# Patient Record
Sex: Female | Born: 1995
Health system: Southern US, Community
[De-identification: ages and names within clinical notes are randomized; demographics above are authoritative.]

## PROBLEM LIST (undated history)

## (undated) DIAGNOSIS — J189 Pneumonia, unspecified organism: Secondary | ICD-10-CM

## (undated) DIAGNOSIS — Z789 Other specified health status: Secondary | ICD-10-CM

## (undated) HISTORY — PX: WISDOM TOOTH EXTRACTION: SHX21

## (undated) HISTORY — PX: NO PAST SURGERIES: SHX2092

---
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
## 2016-04-23 ENCOUNTER — Other Ambulatory Visit: Payer: Self-pay | Admitting: Physician Assistant

## 2016-04-23 NOTE — Telephone Encounter (Signed)
Medication refilled per protocol. 

## 2016-10-08 ENCOUNTER — Other Ambulatory Visit: Payer: Self-pay | Admitting: Physician Assistant

## 2016-10-09 NOTE — Telephone Encounter (Signed)
BCP denied.  Pt has not been seen in well over 1 year.  Letter to pt to make appt.

## 2016-10-10 ENCOUNTER — Ambulatory Visit (INDEPENDENT_AMBULATORY_CARE_PROVIDER_SITE_OTHER): Payer: Commercial Managed Care - HMO | Admitting: Physician Assistant

## 2016-10-10 ENCOUNTER — Encounter: Payer: Self-pay | Admitting: Physician Assistant

## 2016-10-10 DIAGNOSIS — Z309 Encounter for contraceptive management, unspecified: Secondary | ICD-10-CM | POA: Insufficient documentation

## 2016-10-10 DIAGNOSIS — Z3041 Encounter for surveillance of contraceptive pills: Secondary | ICD-10-CM

## 2016-10-10 MED ORDER — DESOGESTREL-ETHINYL ESTRADIOL 0.15-0.02/0.01 MG (21/5) PO TABS: 1.0000 | ORAL_TABLET | Freq: Every day | ORAL | 2 refills | Status: DC

## 2016-10-10 NOTE — Progress Notes (Signed)
Patient ID: Malachi Bonds P. Raphael MRN: [id number], DOB: Nov 15, 1996, 20 y.o. Date of Encounter: @DATE @  Chief Complaint:  Chief Complaint  Patient presents with  . Follow-up    refill on birth control    HPI: 20 y.o. year old female     05/11/2015: presents with her mom for office visit today.  She is actually being seen as a new patient to our office. Also she has form that needs to be completed in preparation for her going to college.  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 At this time she is planning to major in chemistry.  She has 2 sisters and one brother. She is the youngest child. One of the older siblings went to [employer] and 1 went to [employer]. However mom says that these kids are actually back at home right now!! Kacia says that she did play soccer but currently is not playing any sports right now.  Mom states that Christiane had been going to [facility] [employer] [facility] pediatrics. Because she is now 20 years old they will no longer see her there.  Mom reports that Aminat has no past medical history and no history of any chronic medical problems. She has never had any hospitalizations. Has had no surgeries. Takes no medications at all.  She is interested in starting on birth control pills. Just wants to go ahead and start these to be on the safe side since she is leaving and going away to college. Currently is not sexually active. She and her mom have discussed this and both of them have already decided that this is the best course of action. Her menses have been regular and she has very minimal cramping with them.    AT THAT OV---Rxed OCT  10/10/2016: She presents for follow-up visit today to get refill on her birth control pills. She states that since last visit she has been taking the birth control pills routinely. States that this is causing no adverse effects. Her menstrual bleeding is very regular and she has had no irregular bleeding.  She has had no significant cramping. She does not smoke.  She states that she did go to [employer] last year but did not return for this school year. Currently is at [employer] Community [employer]-----plans to start [employer] next semester.   No past medical history on file.   Home Meds: Outpatient Medications Prior to Visit  Medication Sig Dispense Refill  . nitrofurantoin, macrocrystal-monohydrate, (MACROBID) 100 MG capsule Take 1 capsule (100 mg total) by mouth 2 (two) times daily. 6 capsule 0  . VIORELE 0.15-0.02/0.01 MG (21/5) tablet TAKE 1 TABLET BY MOUTH DAILY. 84 tablet 1   No facility-administered medications prior to visit.     Allergies: No Known Allergies    No family history on file.   Review of Systems:  See HPI for pertinent ROS. All other ROS negative.    Physical Exam: Blood pressure 100/62, pulse 73, temperature 98.4 F (36.9 C), resp. rate 16, weight 107 lb (48.5 kg), last menstrual period 10/09/2016, SpO2 98 %., Body mass index is 20.22 kg/m. General: Petite. WNWD Female. Appears in no acute distress. Neck: Supple. No thyromegaly. No lymphadenopathy. Lungs: Clear bilaterally to auscultation without wheezes, rales, or rhonchi. Breathing is unlabored. Heart: RRR with S1 S2. No murmurs, rubs, or gallops. Abdomen: Soft, non-tender, non-distended with normoactive bowel sounds. No hepatomegaly. No rebound/guarding. No obvious abdominal masses. Musculoskeletal:  Strength and tone  normal for age. Extremities/Skin: Warm and dry.  No rashes or suspicious lesions. Neuro: Alert and oriented X 3. Moves all extremities spontaneously. Gait is normal. CNII-XII grossly in tact. Psych:  Responds to questions appropriately with a normal affect.     ASSESSMENT AND PLAN:  20 y.o. year old female with   1. Encounter for surveillance of contraceptive pills At this time I have sent in a year worth of refills on her birth control pills. She will continue to take these daily at the  same time of day. Follow-up one year or sooner if needed.    [address]igned, Yordi Krager Beth NewcastleDixon, [place], [facility] 10/10/2016 3:45 PM

## 2017-01-03 ENCOUNTER — Encounter: Payer: Self-pay | Admitting: Physician Assistant

## 2017-01-03 ENCOUNTER — Ambulatory Visit (INDEPENDENT_AMBULATORY_CARE_PROVIDER_SITE_OTHER): Payer: Commercial Managed Care - HMO | Admitting: Physician Assistant

## 2017-01-03 VITALS — BP 100/70 | HR 73 | Temp 98.3°F | Resp 16 | Wt 111.0 lb

## 2017-01-03 DIAGNOSIS — Z3041 Encounter for surveillance of contraceptive pills: Secondary | ICD-10-CM | POA: Diagnosis not present

## 2017-01-03 MED ORDER — DESOGESTREL-ETHINYL ESTRADIOL 0.15-30 MG-MCG PO TABS: 1.0000 | ORAL_TABLET | Freq: Every day | ORAL | 11 refills | Status: DC

## 2017-01-03 NOTE — Progress Notes (Signed)
Patient ID: Malachi Bonds P. Priore MRN: [id number], DOB: Dec 29, 1996, 21 y.o. Date of Encounter: @DATE @  Chief Complaint:  Chief Complaint  Patient presents with  . discuss new birth control    HPI: 21 y.o. year old female     05/11/2015: presents with her mom for office visit today.  She is actually being seen as a new patient to our office. Also she has form that needs to be completed in preparation for her going to college.  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  She has 2 sisters and one brother. She is the youngest child. One of the older siblings went to [employer] and 1 went to [employer]. However mom says that these kids are actually back at home right now!! Tatiana says that she did play soccer but currently is not playing any sports right now.  Mom states that Hisayo had been going to [employer] pediatrics. Because she is now 21 years old they will no longer see her there.  Mom reports that Leith has no past medical history and no history of any chronic medical problems. She has never had any hospitalizations. Has had no surgeries. Takes no medications at all.  She is interested in starting on birth control pills. Just wants to go ahead and start these to be on the safe side since she is leaving and going away to college. Currently is not sexually active. She and her mom have discussed this and both of them have already decided that this is the best course of action. Her menses have been regular and she has very minimal cramping with them.    AT THAT OV---Rxed OCT  10/10/2016: She presents for follow-up visit today to get refill on her birth control pills. She states that since last visit she has been taking the birth control pills routinely. States that this is causing no adverse effects. Her menstrual bleeding is very regular and she has had no irregular bleeding. She has had  no significant cramping. She does not smoke.  She states that she did go to [employer] last year but did not return for this school year. Currently is at [employer] Community [employer]-----plans to start [employer] next semester.  01/03/2017: She reports that the cost on her birth control pills keeps going up. Cost has gone up to more than $100 and she cannot afford that. Her sister has helped her research and she knows which pill would be inexpensive but similar to her current pill.----Apri----needs to change to Apri.. No other concerns to address today.   No past medical history on file.   Home Meds: Outpatient Medications Prior to Visit  Medication Sig Dispense Refill  . desogestrel-ethinyl estradiol (VIORELE) 0.15-0.02/0.01 MG (21/5) tablet Take 1 tablet by mouth daily. 84 tablet 2   No facility-administered medications prior to visit.     Allergies: No Known Allergies    No family history on file.   Review of Systems:  See HPI for pertinent ROS. All other ROS negative.    Physical Exam: Blood pressure 100/70, pulse 73, temperature 98.3 F (36.8 C), temperature source Oral, resp. rate 16, weight 111 lb (50.3 kg), last menstrual period 12/31/2016, SpO2 98 %., Body mass index is 20.97 kg/m. General: Petite. WNWD Female. Appears in no acute distress. Neck: Supple. No thyromegaly. No lymphadenopathy. Lungs: Clear bilaterally to auscultation without wheezes, rales,  or rhonchi. Breathing is unlabored. Heart: RRR with S1 S2. No murmurs, rubs, or gallops. Abdomen: Soft, non-tender, non-distended with normoactive bowel sounds. No hepatomegaly. No rebound/guarding. No obvious abdominal masses. Musculoskeletal:  Strength and tone normal for age. Extremities/Skin: Warm and dry.  No rashes or suspicious lesions. Neuro: Alert and oriented X 3. Moves all extremities spontaneously. Gait is normal. CNII-XII grossly in tact. Psych:  Responds to questions appropriately with a normal affect.      ASSESSMENT AND PLAN:  21 y.o. year old female with   1. Encounter for surveillance of contraceptive pills At this time I have Ch[facility]. F/U office visit one year or sooner if needed.  Signed, [address]Mary Beth AndersonDixon, [place], [facility] 01/03/2017 8:40 AM

## 2017-12-03 ENCOUNTER — Other Ambulatory Visit: Payer: Self-pay | Admitting: Physician Assistant

## 2017-12-03 DIAGNOSIS — Z3041 Encounter for surveillance of contraceptive pills: Secondary | ICD-10-CM

## 2017-12-03 NOTE — Telephone Encounter (Signed)
Refill appropriate 

## 2017-12-06 ENCOUNTER — Other Ambulatory Visit: Payer: Self-pay

## 2017-12-06 DIAGNOSIS — Z3041 Encounter for surveillance of contraceptive pills: Secondary | ICD-10-CM

## 2017-12-26 ENCOUNTER — Other Ambulatory Visit: Payer: Self-pay

## 2018-01-26 ENCOUNTER — Other Ambulatory Visit: Payer: Self-pay | Admitting: Physician Assistant

## 2018-01-26 DIAGNOSIS — Z3041 Encounter for surveillance of contraceptive pills: Secondary | ICD-10-CM

## 2018-02-24 ENCOUNTER — Other Ambulatory Visit: Payer: Self-pay | Admitting: Physician Assistant

## 2018-02-24 DIAGNOSIS — Z3041 Encounter for surveillance of contraceptive pills: Secondary | ICD-10-CM

## 2018-03-26 ENCOUNTER — Other Ambulatory Visit: Payer: Self-pay | Admitting: Physician Assistant

## 2018-03-26 DIAGNOSIS — Z3041 Encounter for surveillance of contraceptive pills: Secondary | ICD-10-CM

## 2018-03-26 NOTE — Telephone Encounter (Signed)
Rx denied patient is due for an office visit letter mailed for patient to call and schedule an appointment

## 2018-03-27 ENCOUNTER — Encounter: Payer: Self-pay | Admitting: Physician Assistant

## 2018-03-27 ENCOUNTER — Ambulatory Visit: Payer: Commercial Managed Care - HMO | Admitting: Physician Assistant

## 2018-03-27 ENCOUNTER — Other Ambulatory Visit: Payer: Self-pay

## 2018-03-27 VITALS — BP 110/72 | HR 61 | Temp 98.2°F | Resp 14 | Ht 61.0 in | Wt 110.4 lb

## 2018-03-27 DIAGNOSIS — Z3041 Encounter for surveillance of contraceptive pills: Secondary | ICD-10-CM | POA: Diagnosis not present

## 2018-03-27 MED ORDER — DESOGESTREL-ETHINYL ESTRADIOL 0.15-30 MG-MCG PO TABS: 1.0000 | ORAL_TABLET | Freq: Every day | ORAL | 3 refills | Status: DC

## 2018-03-27 NOTE — Progress Notes (Signed)
Patient ID: Michele Poole MRN: [id number], DOB: 1996/11/08, 22 y.o. Date of Encounter: @DATE @  Chief Complaint:  Chief Complaint  Patient presents with  . Medication Refill    HPI: 22 y.o. year old female     05/11/2015: presents with her mom for office visit today.  She is actually being seen as a new patient to our office. Also she has form that needs to be completed in preparation for her going to college.  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 At this time she is planning to major in chemistry.  She has 2 sisters and one brother. She is the youngest child. One of the older siblings went to [employer] and 1 went to [employer]. However mom says that these kids are actually back at home right now!! Michele BondsGloria says that she did play soccer but currently is not playing any sports right now.  Mom states that Michele BondsGloria had been going to [facility] [employer]. Because she is now 22 years old they will no longer see her there.  Mom reports that Michele BondsGloria has no past medical history and no history of any chronic medical problems. She has never had any hospitalizations. Has had no surgeries. Takes no medications at all.  She is interested in starting on birth control pills. Just wants to go ahead and start these to be on the safe side since she is leaving and going away to college. Currently is not sexually active. She and her mom have discussed this and both of them have already decided that this is the best course of action. Her menses have been regular and she has very minimal cramping with them.    AT THAT OV---Rxed OCT  10/10/2016: She presents for follow-up visit today to get refill on her birth control pills. She states that since last visit she has been taking the birth control pills routinely. States that this is causing no adverse effects. Her menstrual bleeding is very regular and she has had no irregular bleeding. She has had no  significant cramping. She does not smoke.  She states that she did go [employer] last year but did not return for this school year. Currently is at [employer]-----plans to start [employer] next semester.  01/03/2017: She reports that the cost on her birth control pills keeps going up. Cost has gone up to more than $100 and she cannot afford that. Her sister has helped her research and she knows which pill would be inexpensive but similar to her current pill.----Apri----needs to change to Apri.. No other concerns to address today.   03/27/2018: Reviewed that at last visit we had changed to the Apri birth control pill.  States that this has worked well for her and has caused no side effects.  Says that she feels the same as she did with prior pill.  Having regular menses every 28 days.  No other concerns to address today.  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     History reviewed. No pertinent past medical history.   Home Meds: Outpatient Medications Prior to Visit  Medication Sig Dispense Refill  . APRI 0.15-30 MG-MCG tablet TAKE 1 TABLET BY MOUTH EVERY DAY 28 tablet 0   No facility-administered medications prior to visit.     Allergies: No Known Allergies  History reviewed. No pertinent family history.   Review of Systems:  See HPI for pertinent ROS. All other ROS negative.    Physical Exam: Blood pressure 110/72, pulse 61, temperature 98.2 F (36.8 C), temperature source Oral, resp. rate 14, height 5\' 1"  (1.549 m), weight 50.1 kg (110 lb 6.4 oz), last menstrual period 03/25/2018, SpO2 98 %., Body mass index is 20.86 kg/m. General: Petite. WNWD Female. Appears in no acute distress. Neck: Supple. No thyromegaly. No lymphadenopathy. Lungs: Clear bilaterally to auscultation without wheezes, rales, or rhonchi. Breathing is unlabored. Heart: RRR with S1 S2. No murmurs, rubs, or  gallops. Abdomen: Soft, non-tender, non-distended with normoactive bowel sounds. No hepatomegaly. No rebound/guarding. No obvious abdominal masses. Musculoskeletal:  Strength and tone normal for age. Extremities/Skin: Warm and dry.  No rashes or suspicious lesions. Neuro: Alert and oriented X 3. Moves all extremities spontaneously. Gait is normal. CNII-XII grossly in tact. Psych:  Responds to questions appropriately with a normal affect.     ASSESSMENT AND PLAN:  22 y.o. year old female with   1. Encounter for surveillance of contraceptive pills  03/27/2018: I discussed that since she is now 22 years old we need to do a Pap smear so we need to do breast exam and pelvic exam.  Was getting the things out to prepare for this and she then says that she is on her menses and has an tampon currently.  Told her that we cannot do that exam today.  Discussed to make sure that for her next visit she schedules this when she would not be on her menses as we will need to do breast exam pelvic exam and Pap smear at that time.  She voices understanding and agrees.  - desogestrel-ethinyl estradiol (APRI) 0.15-30 MG-MCG tablet; Take 1 tablet by mouth daily.  Dispense: 3 Package; Refill: 3   Signed, [address] [place], [place], [facility] 03/27/2018 2:22 PM

## 2018-04-13 DIAGNOSIS — J029 Acute pharyngitis, unspecified: Secondary | ICD-10-CM | POA: Diagnosis not present

## 2018-12-03 ENCOUNTER — Telehealth: Payer: Self-pay | Admitting: Family Medicine

## 2018-12-03 DIAGNOSIS — Z3041 Encounter for surveillance of contraceptive pills: Secondary | ICD-10-CM

## 2018-12-03 MED ORDER — DESOGESTREL-ETHINYL ESTRADIOL 0.15-30 MG-MCG PO TABS: 1.0000 | ORAL_TABLET | Freq: Every day | ORAL | 3 refills | Status: DC

## 2018-12-03 NOTE — Telephone Encounter (Signed)
[facility] [address] She has scheduled an app with Diablo to change from mbd to her. Her last pill is on Friday and she has no refills as of right now.

## 2018-12-03 NOTE — Telephone Encounter (Signed)
Prescription sent to pharmacy.

## 2018-12-12 ENCOUNTER — Ambulatory Visit: Payer: 59 | Admitting: Family Medicine

## 2019-01-06 ENCOUNTER — Ambulatory Visit: Payer: 59 | Admitting: Family Medicine

## 2019-02-09 ENCOUNTER — Encounter: Payer: Self-pay | Admitting: Family Medicine

## 2019-02-09 ENCOUNTER — Ambulatory Visit: Payer: 59 | Admitting: Family Medicine

## 2019-02-09 VITALS — BP 102/58 | HR 68 | Temp 98.6°F | Resp 14 | Ht 61.0 in | Wt 111.0 lb

## 2019-02-09 DIAGNOSIS — Z309 Encounter for contraceptive management, unspecified: Secondary | ICD-10-CM | POA: Diagnosis not present

## 2019-02-09 DIAGNOSIS — Z3041 Encounter for surveillance of contraceptive pills: Secondary | ICD-10-CM | POA: Diagnosis not present

## 2019-02-09 LAB — PREGNANCY, URINE: PREG TEST UR: NEGATIVE

## 2019-02-09 MED ORDER — DESOGESTREL-ETHINYL ESTRADIOL 0.15-30 MG-MCG PO TABS: 1.0000 | ORAL_TABLET | Freq: Every day | ORAL | 3 refills | Status: DC

## 2019-02-09 NOTE — Patient Instructions (Addendum)
Recommend you schedule  PAP Smear within the next 6 months

## 2019-02-09 NOTE — Progress Notes (Signed)
   Subjective:    Patient ID: Michele Poole, female    DOB: [birth year], 23 y.o.   MRN: [id number]  Patient presents for Medication Review/ Refill Doing okay  No concerns  LMP- Jan 28th, regulary , taking Apri daily  No side effects    Declines STI screening / sexually active Due for PAP Smear   Denies ETOH/ Alcohol  Exercises regulary    Review Of Systems:  GEN- denies fatigue, fever, weight loss,weakness, recent illness HEENT- denies eye drainage, change in vision, nasal discharge, CVS- denies chest pain, palpitations RESP- denies SOB, cough, wheeze ABD- denies N/V, change in stools, abd pain GU- denies dysuria, hematuria, dribbling, incontinence MSK- denies joint pain, muscle aches, injury Neuro- denies headache, dizziness, syncope, seizure activity       Objective:    BP (!) 102/58   Pulse 68   Temp 98.6 F (37 C) (Oral)   Resp 14   Ht 5\' 1"  (1.549 m)   Wt 111 lb (50.3 kg)   LMP 01/27/2019 Comment: regular  SpO2 99%   BMI 20.97 kg/m  GEN- NAD, alert and oriented x3 HEENT- PERRL, EOMI, non injected sclera, pink conjunctiva, MMM, oropharynx clear Neck- Supple, no thyromegaly CVS- RRR, no murmur RESP-CTAB EXT- No edema Pulses- Radial  2+        Assessment & Plan:      Problem List Items Addressed This Visit      Unprioritized   Contraception management - Primary    Urine pregnancy is negative.  Blood pressure is normal.  She is not had a significant weight gain or any side effects.  She will return to have her Pap smear done.  Continue birth control at this time.      Relevant Medications   desogestrel-ethinyl estradiol (APRI) 0.15-30 MG-MCG tablet   Other Relevant Orders   Pregnancy, urine (Completed)      Note: This dictation was prepared with Dragon dictation along with smaller phrase technology. Any transcriptional errors that result from this process are unintentional.

## 2019-02-09 NOTE — Assessment & Plan Note (Signed)
Urine pregnancy is negative.  Blood pressure is normal.  She is not had a significant weight gain or any side effects.  She will return to have her Pap smear done.  Continue birth control at this time.

## 2019-03-04 ENCOUNTER — Ambulatory Visit: Payer: 59 | Admitting: Family Medicine

## 2019-03-04 ENCOUNTER — Other Ambulatory Visit: Payer: Self-pay

## 2019-03-04 ENCOUNTER — Encounter: Payer: Self-pay | Admitting: Family Medicine

## 2019-03-04 VITALS — BP 112/58 | HR 68 | Temp 97.9°F | Resp 12 | Ht 61.0 in | Wt 110.0 lb

## 2019-03-04 DIAGNOSIS — Z23 Encounter for immunization: Secondary | ICD-10-CM | POA: Diagnosis not present

## 2019-03-04 DIAGNOSIS — Z0000 Encounter for general adult medical examination without abnormal findings: Secondary | ICD-10-CM | POA: Diagnosis not present

## 2019-03-04 DIAGNOSIS — Z124 Encounter for screening for malignant neoplasm of cervix: Secondary | ICD-10-CM | POA: Diagnosis not present

## 2019-03-04 NOTE — Patient Instructions (Addendum)
I recommend eye visit once a year I recommend dental visit every 6 months Goal is to  Exercise 30 minutes 5 days a week We will call  with lab results  F/U 1 year

## 2019-03-04 NOTE — Addendum Note (Signed)
Addended by: Phillips Odor on: 03/04/2019 04:25 PM   Modules accepted: Orders

## 2019-03-04 NOTE — Progress Notes (Signed)
   Subjective:    Patient ID: Michele Poole P. Holz, female    DOB: 10-23-1996, 23 y.o.   MRN: [id number]  Patient presents for Gynecologic Exam (is not fasting)  Pt here for GYN Exam PAP Smear Sexually active with one partner.  She declines STD screening and HIV screening. Last visit 2 weeks ago to get OCP refilled Last menstrual cycle ended on Friday.  Family history reviewed.  Immunizations reviewed she is due for meningitis boosters as well as tetanus  Declines flu shot  No concerns  Review Of Systems:  GEN- denies fatigue, fever, weight loss,weakness, recent illness HEENT- denies eye drainage, change in vision, nasal discharge, CVS- denies chest pain, palpitations RESP- denies SOB, cough, wheeze ABD- denies N/V, change in stools, abd pain GU- denies dysuria, hematuria, dribbling, incontinence MSK- denies joint pain, muscle aches, injury Neuro- denies headache, dizziness, syncope, seizure activity       Objective:    BP (!) 112/58   Pulse 68   Temp 97.9 F (36.6 C) (Oral)   Resp 12   Ht 5\' 1"  (1.549 m)   Wt 110 lb (49.9 kg)   LMP 02/23/2019   SpO2 100%   BMI 20.78 kg/m  GEN- NAD, alert and oriented x3 HEENT- PERRL, EOMI, non injected sclera, pink conjunctiva, MMM, oropharynx clear Neck- Supple, no thyromegaly CVS- RRR, no murmur RESP-CTAB Breast- normal symmetry, no nipple inversion,no nipple drainage, no nodules or lumps felt Nodes- no axillary nodes ABD-NABS,soft,NT,ND GU- normal external genitalia, vaginal mucosa pink and moist, cervix visualized no growth,mild  blood form os, no  discharge, no CMT, no ovarian masses, uterus normal size EXT- No edema Pulses- Radial, DP- 2+        Assessment & Plan:      Problem List Items Addressed This Visit    None    Visit Diagnoses    Cervical cancer screening    -  Primary   Relevant Orders   Pap IG w/ reflex to HPV when ASC-U   Routine general medical examination at a health care facility       CPE done, check  CBC.Metabolic, TDAP, Menactra, MenB vaccine given   Relevant Orders   CBC with Differential/Platelet   Comprehensive metabolic panel      Note: This dictation was prepared with Dragon dictation along with smaller phrase technology. Any transcriptional errors that result from this process are unintentional.

## 2019-03-05 LAB — CBC WITH DIFFERENTIAL/PLATELET
Absolute Monocytes: 420 {cells}/uL (ref 200–950)
Basophils Absolute: 60 {cells}/uL (ref 0–200)
Basophils Relative: 1.2 %
Eosinophils Absolute: 260 {cells}/uL (ref 15–500)
Eosinophils Relative: 5.2 %
HCT: 43.7 % (ref 35.0–45.0)
Hemoglobin: 14.4 g/dL (ref 11.7–15.5)
Lymphs Abs: 1695 {cells}/uL (ref 850–3900)
MCH: 28.7 pg (ref 27.0–33.0)
MCHC: 33 g/dL (ref 32.0–36.0)
MCV: 87.1 fL (ref 80.0–100.0)
MPV: 9.9 fL (ref 7.5–12.5)
Monocytes Relative: 8.4 %
Neutro Abs: 2565 {cells}/uL (ref 1500–7800)
Neutrophils Relative %: 51.3 %
Platelets: 238 Thousand/uL (ref 140–400)
RBC: 5.02 Million/uL (ref 3.80–5.10)
RDW: 11.3 % (ref 11.0–15.0)
Total Lymphocyte: 33.9 %
WBC: 5 Thousand/uL (ref 3.8–10.8)

## 2019-03-05 LAB — COMPREHENSIVE METABOLIC PANEL WITH GFR
AG Ratio: 1.5 (calc) (ref 1.0–2.5)
ALT: 11 U/L (ref 6–29)
AST: 15 U/L (ref 10–30)
Albumin: 4.4 g/dL (ref 3.6–5.1)
Alkaline phosphatase (APISO): 49 U/L (ref 31–125)
BUN: 13 mg/dL (ref 7–25)
CO2: 27 mmol/L (ref 20–32)
Calcium: 9.3 mg/dL (ref 8.6–10.2)
Chloride: 105 mmol/L (ref 98–110)
Creat: 0.83 mg/dL (ref 0.50–1.10)
Globulin: 3 g/dL (ref 1.9–3.7)
Glucose, Bld: 91 mg/dL (ref 65–99)
Potassium: 3.8 mmol/L (ref 3.5–5.3)
Sodium: 140 mmol/L (ref 135–146)
Total Bilirubin: 1 mg/dL (ref 0.2–1.2)
Total Protein: 7.4 g/dL (ref 6.1–8.1)

## 2019-03-05 LAB — PAP IG W/ RFLX HPV ASCU

## 2019-03-06 ENCOUNTER — Encounter: Payer: Self-pay | Admitting: *Deleted

## 2019-10-15 ENCOUNTER — Other Ambulatory Visit: Payer: Self-pay

## 2019-10-15 DIAGNOSIS — Z20822 Contact with and (suspected) exposure to covid-19: Secondary | ICD-10-CM

## 2019-10-16 LAB — NOVEL CORONAVIRUS, NAA: SARS-CoV-2, NAA: NOT DETECTED

## 2020-01-24 ENCOUNTER — Other Ambulatory Visit: Payer: Self-pay | Admitting: Family Medicine

## 2020-01-24 DIAGNOSIS — Z3041 Encounter for surveillance of contraceptive pills: Secondary | ICD-10-CM

## 2020-12-27 ENCOUNTER — Other Ambulatory Visit: Payer: Self-pay | Admitting: Family Medicine

## 2020-12-27 DIAGNOSIS — Z3041 Encounter for surveillance of contraceptive pills: Secondary | ICD-10-CM

## 2022-01-06 DIAGNOSIS — J209 Acute bronchitis, unspecified: Secondary | ICD-10-CM | POA: Diagnosis not present

## 2022-04-23 ENCOUNTER — Encounter: Payer: Self-pay | Admitting: Family Medicine

## 2022-04-23 ENCOUNTER — Ambulatory Visit: Payer: BC Managed Care – PPO | Admitting: Family Medicine

## 2022-04-23 VITALS — BP 100/70 | HR 55 | Temp 97.8°F | Ht 61.0 in | Wt 113.4 lb

## 2022-04-23 DIAGNOSIS — Z23 Encounter for immunization: Secondary | ICD-10-CM

## 2022-04-23 DIAGNOSIS — Z3041 Encounter for surveillance of contraceptive pills: Secondary | ICD-10-CM

## 2022-04-23 DIAGNOSIS — Z0000 Encounter for general adult medical examination without abnormal findings: Secondary | ICD-10-CM | POA: Diagnosis not present

## 2022-04-23 MED ORDER — DESOGESTREL-ETHINYL ESTRADIOL 0.15-30 MG-MCG PO TABS: 1.0000 | ORAL_TABLET | Freq: Every day | ORAL | 3 refills | Status: DC

## 2022-04-23 NOTE — Progress Notes (Signed)
Annual Exam  ? ?Chief Complaint:  ?Chief Complaint  ?Patient presents with  ? Establish Care  ? ? ?History of Present Illness:  ?Ms. Michele Poole is a 26 y.o. No obstetric history on file. who LMP was Patient's last menstrual period was 04/17/2022 (exact date)., presents today for her annual examination.   ? ? ? ?Nutrition/Lifestyle ?Diet: generally good ?Exercise: running ?She does get adequate calcium and Vitamin D in her diet. ? ?Social History  ? ?Tobacco Use  ?Smoking Status Never  ?Smokeless Tobacco Never  ? ?Social History  ? ?Substance and Sexual Activity  ?Alcohol Use Yes  ? Comment: monthy, 1-2 drinks  ? ?Social History  ? ?Substance and Sexual Activity  ?Drug Use No  ? ? ? ?Safety ?The patient wears seatbelts: yes.     ?The patient feels safe at home and in their relationships: yes. ? ?General Health ?Dentist in the last year: Yes ?Eye doctor: not applicable ? ?Menstrual ?Her menses are regular, on ocp, light ? ?GYN ?She is single partner, contraception - OCP (estrogen/progesterone).  ?Hx of STDs: none ?She does not want STI testing today.  ?GC/C screening recommended for sexually active women 20 and younger ? ? ?Cervical Cancer Screening (Age 76-65) ?Last Pap:  March 2020 Results were: no abnormalities with HPV not done ? ?Family History of Breast Cancer: yes ?Family History of Ovarian Cancer: no ? ? ? ?Weight ?Wt Readings from Last 3 Encounters:  ?04/23/22 113 lb 6 oz (51.4 kg)  ?03/04/19 110 lb (49.9 kg)  ?02/09/19 111 lb (50.3 kg)  ? ?Patient has normal BMI  ?BMI Readings from Last 1 Encounters:  ?04/23/22 21.42 kg/m?  ? ? ? ?Chronic disease screening ?Blood pressure monitoring:  ?BP Readings from Last 3 Encounters:  ?04/23/22 100/70  ?03/04/19 (!) 112/58  ?02/09/19 (!) 102/58  ? ? ? ?Lipid Monitoring: Indication for screening: age >28, obesity, diabetes, family hx, CV risk factors.  ?Lipid screening: no ? ?No results found for: CHOL, HDL, LDLCALC, LDLDIRECT, TRIG, CHOLHDL ? ? ?Diabetes Screening:  age >11, overweight, family hx, PCOS, hx of gestational diabetes, at risk ethnicity, elevated blood pressure >135/80.  ?Diabetes Screening screening: No ? ?No results found for: HGBA1C ? ? ? ?History reviewed. No pertinent past medical history. ? ?History reviewed. No pertinent surgical history. ? ?Prior to Admission medications   ?Medication Sig Start Date End Date Taking? Authorizing Provider  ?APRI 0.15-30 MG-MCG tablet TAKE 1 TABLET BY MOUTH EVERY DAY 12/27/20  Yes , Velna Hatchet, MD  ? ? ?No Known Allergies ? ?Gynecologic History: Patient's last menstrual period was 04/17/2022 (exact date). ? ?Obstetric History: No obstetric history on file. ? ?Social History  ? ?Socioeconomic History  ? Marital status: Single  ?  Spouse name: Not on file  ? Number of children: Not on file  ? Years of education: associates  ? Highest education level: Not on file  ?Occupational History  ? Occupation: Production designer, theatre/television/film  ?  Comment: [employer]  ?Tobacco Use  ? Smoking status: Never  ? Smokeless tobacco: Never  ?Vaping Use  ? Vaping Use: Never used  ?Substance and Sexual Activity  ? Alcohol use: Yes  ?  Comment: monthy, 1-2 drinks  ? Drug use: No  ? Sexual activity: Yes  ?  Birth control/protection: Pill  ?Other Topics Concern  ? Not on file  ?Social History Narrative  ? 04/23/22  ? From: the area  ? Living: with parents  ? Work: Production designer, theatre/television/film at [employer]  by [employer]  ?   ? Family: has siblings, lives with parents, good relationships  ?   ? Enjoys: tennis, running  ?   ? Diet: generally good  ? Exercise: running  ?   ? Safety  ? Seat belts: Yes   ? Guns: Yes  and secure  ? Safe in relationships: Yes   ?   ? ?Social Determinants of Health  ? ?Financial Resource Strain: Not on file  ?Food Insecurity: Not on file  ?Transportation Needs: Not on file  ?Physical Activity: Not on file  ?Stress: Not on file  ?Social Connections: Not on file  ?Intimate Partner Violence: Not on file  ? ? ?Family History  ?Problem Relation Age of Onset  ?  Hypertension Mother   ? Hyperlipidemia Father   ? Diabetes Paternal Grandmother   ? Hypertension Paternal Grandmother   ? Breast cancer Paternal Grandmother 24  ? Stroke Paternal Grandmother 40  ? ? ?Review of Systems  ?Constitutional:  Negative for chills and fever.  ?HENT:  Negative for congestion and sore throat.   ?Eyes:  Negative for blurred vision and double vision.  ?Respiratory:  Negative for shortness of breath.   ?Cardiovascular:  Negative for chest pain.  ?Gastrointestinal:  Negative for heartburn, nausea and vomiting.  ?Genitourinary: Negative.   ?Musculoskeletal: Negative.  Negative for myalgias.  ?Skin:  Negative for rash.  ?Neurological:  Negative for dizziness and headaches.  ?Endo/Heme/Allergies:  Does not bruise/bleed easily.  ?Psychiatric/Behavioral:  Negative for depression. The patient is not nervous/anxious.    ? ?Physical Exam ?BP 100/70   Pulse (!) 55   Temp 97.8 ?F (36.6 ?C) (Oral)   Ht 5\' 1"  (1.549 m)   Wt 113 lb 6 oz (51.4 kg)   LMP 04/17/2022 (Exact Date)   SpO2 100%   BMI 21.42 kg/m?   ? ?BP Readings from Last 3 Encounters:  ?04/23/22 100/70  ?03/04/19 (!) 112/58  ?02/09/19 (!) 102/58  ? ? ?Wt Readings from Last 3 Encounters:  ?04/23/22 113 lb 6 oz (51.4 kg)  ?03/04/19 110 lb (49.9 kg)  ?02/09/19 111 lb (50.3 kg)  ? ? ? ?Physical Exam ?Constitutional:   ?   General: She is not in acute distress. ?   Appearance: She is well-developed. She is not diaphoretic.  ?HENT:  ?   Head: Normocephalic and atraumatic.  ?   Right Ear: External ear normal.  ?   Left Ear: External ear normal.  ?   Nose: Nose normal.  ?Eyes:  ?   General: No scleral icterus. ?   Extraocular Movements: Extraocular movements intact.  ?   Conjunctiva/sclera: Conjunctivae normal.  ?Cardiovascular:  ?   Rate and Rhythm: Normal rate and regular rhythm.  ?   Heart sounds: No murmur heard. ?Pulmonary:  ?   Effort: Pulmonary effort is normal. No respiratory distress.  ?   Breath sounds: Normal breath sounds. No wheezing.   ?Abdominal:  ?   General: Bowel sounds are normal. There is no distension.  ?   Palpations: Abdomen is soft. There is no mass.  ?   Tenderness: There is no abdominal tenderness. There is no guarding or rebound.  ?Musculoskeletal:     ?   General: Normal range of motion.  ?   Cervical back: Neck supple.  ?Lymphadenopathy:  ?   Cervical: No cervical adenopathy.  ?Skin: ?   General: Skin is warm and dry.  ?   Capillary Refill: Capillary refill takes less than 2  seconds.  ?Neurological:  ?   Mental Status: She is alert and oriented to person, place, and time.  ?   Deep Tendon Reflexes: Reflexes normal.  ?Psychiatric:     ?   Mood and Affect: Mood normal.     ?   Behavior: Behavior normal.  ? ? ? ? ?Results: ? ?  04/23/2022  ?  2:52 PM 03/04/2019  ?  3:12 PM 02/09/2019  ?  2:48 PM  ?Depression screen PHQ 2/9  ?Decreased Interest 0 0 0  ?Down, Depressed, Hopeless 0 0 0  ?PHQ - 2 Score 0 0 0  ? ? ? ? ?Assessment: 26 y.o. No obstetric history on file. female here for routine annual examination. ? ?Plan: ?Problem List Items Addressed This Visit   ? ?  ? Other  ? Contraception management  ? Relevant Medications  ? desogestrel-ethinyl estradiol (APRI) 0.15-30 MG-MCG tablet  ? ?Other Visit Diagnoses   ? ? Annual physical exam    -  Primary  ? Need for HPV vaccination      ? Relevant Orders  ? HPV 9-valent vaccine,Recombinat (Completed)  ? ?  ? ? ? ?Screening: ?-- Blood pressure screen normal ?-- cholesterol screening: not due for screening ?-- Weight screening: normal ?-- Diabetes Screening: not due for screening ?-- Nutrition: encouraged healthy diet ? ? ?Psych ?-- Depression screening (PHQ-9):  ? ?  04/23/2022  ?  2:52 PM 03/04/2019  ?  3:12 PM 02/09/2019  ?  2:48 PM  ?Depression screen PHQ 2/9  ?Decreased Interest 0 0 0  ?Down, Depressed, Hopeless 0 0 0  ?PHQ - 2 Score 0 0 0  ? ? ? ? ? ?Safety ?-- tobacco screening: not using ?-- alcohol screening: = low-risk usage. ?-- no evidence of domestic violence or intimate partner  violence. ?-- STD screening: gonorrhea/chlamydia NAAT (Recommended for <24 years) not collected per patient request. ? ?Cancer Screening ?-- pap smear not collected - declined - will get next visit ?-- family history of breas

## 2022-06-21 ENCOUNTER — Ambulatory Visit (INDEPENDENT_AMBULATORY_CARE_PROVIDER_SITE_OTHER): Payer: BC Managed Care – PPO | Admitting: Nurse Practitioner

## 2022-06-21 ENCOUNTER — Encounter: Payer: Self-pay | Admitting: Nurse Practitioner

## 2022-06-21 VITALS — BP 105/70 | HR 56 | Temp 98.2°F | Ht 61.0 in | Wt 113.6 lb

## 2022-06-21 DIAGNOSIS — J029 Acute pharyngitis, unspecified: Secondary | ICD-10-CM

## 2022-06-21 LAB — POCT RAPID STREP A (OFFICE): Rapid Strep A Screen: NEGATIVE

## 2022-06-21 NOTE — Patient Instructions (Addendum)
Use warm salt water gaggle as needed or chloraseptic lozenges/spray as needed or ibuprofen 200-400mg  every 6-8hrs sorethroat is due to post nasal drainage. Can take claritin 10mg  1tabd daily x 1week Call office if no improvement in 1week.

## 2022-06-21 NOTE — Progress Notes (Signed)
   Acute Office Visit  Subjective:    Patient ID: Michele Poole, female    DOB: [birth year], 26 y.o.   MRN: [id number]  Chief Complaint  Patient presents with   Acute Visit    Swollen uvula x 1 week , creates lots of saliva and difficulty swallowing. No other concerns   Sore Throat  This is a new problem. The current episode started 1 to 4 weeks ago. The problem has been unchanged. There has been no fever. The pain is mild. Pertinent negatives include no abdominal pain, congestion, coughing, diarrhea, drooling, ear discharge, ear pain, headaches, hoarse voice, plugged ear sensation, neck pain, shortness of breath, stridor, swollen glands, trouble swallowing or vomiting. She has had no exposure to strep or mono. She has tried nothing for the symptoms.   Outpatient Medications Prior to Visit  Medication Sig   desogestrel-ethinyl estradiol (APRI) 0.15-30 MG-MCG tablet Take 1 tablet by mouth daily.   No facility-administered medications prior to visit.   Reviewed past medical and social history.  Review of Systems  HENT:  Negative for congestion, drooling, ear discharge, ear pain, hoarse voice and trouble swallowing.   Respiratory:  Negative for cough, shortness of breath and stridor.   Gastrointestinal:  Negative for abdominal pain, diarrhea and vomiting.  Musculoskeletal:  Negative for neck pain.  Neurological:  Negative for headaches.   Per HPI     Objective:    Physical Exam Vitals reviewed.  HENT:     Head:     Salivary Glands: Right salivary gland is not diffusely enlarged or tender. Left salivary gland is not diffusely enlarged or tender.     Mouth/Throat:     Tongue: No lesions.     Palate: No mass.     Pharynx: Uvula midline. Posterior oropharyngeal erythema present. No oropharyngeal exudate or uvula swelling.     Tonsils: No tonsillar exudate.  Musculoskeletal:     Cervical back: Normal range of motion.  Lymphadenopathy:     Cervical: No cervical adenopathy.   Neurological:     Mental Status: She is alert.    BP 105/70 (BP Location: Right Arm, Patient Position: Sitting, Cuff Size: Small)   Pulse (!) 56   Temp 98.2 F (36.8 C) (Temporal)   Ht 5\' 1"  (1.549 m)   Wt 113 lb 9.6 oz (51.5 kg)   SpO2 99%   BMI 21.46 kg/m    No results found for any visits on 06/21/22.     Assessment & Plan:   Problem List Items Addressed This Visit   None Visit Diagnoses     Acute pharyngitis, unspecified etiology    -  Primary   Relevant Orders   POCT rapid strep A     Use warm salt water gaggle as needed or chloraseptic lozenges/spray as needed or ibuprofen 200-400mg  every 6-8hrs sorethroat is due to post nasal drainage. Can take claritin 10mg  1tabd daily x 1week Call office if no improvement in 1week.  No orders of the defined types were placed in this encounter.  Return if symptoms worsen or fail to improve.    06/23/22, NP

## 2022-09-17 ENCOUNTER — Ambulatory Visit: Payer: BC Managed Care – PPO | Admitting: Family Medicine

## 2022-09-17 VITALS — BP 82/60 | HR 72 | Temp 97.8°F | Wt 115.0 lb

## 2022-09-17 DIAGNOSIS — R1909 Other intra-abdominal and pelvic swelling, mass and lump: Secondary | ICD-10-CM | POA: Diagnosis not present

## 2022-09-17 NOTE — Patient Instructions (Signed)
Schedule the ultrasound  If it goes away, ok to cancel  Update if any new symptoms

## 2022-09-17 NOTE — Progress Notes (Signed)
   Subjective:     Michele Poole is a 26 y.o. female presenting for Mass (Inner right leg x 1 month. Grown in size )     HPI  #lump - x 1 month  - occasionally painful - no drainage - no redness - on the thigh - growing in size   Review of Systems   Social History   Tobacco Use  Smoking Status Never  Smokeless Tobacco Never        Objective:    BP Readings from Last 3 Encounters:  09/17/22 (!) 82/60  06/21/22 105/70  04/23/22 100/70   Wt Readings from Last 3 Encounters:  09/17/22 115 lb (52.2 kg)  06/21/22 113 lb 9.6 oz (51.5 kg)  04/23/22 113 lb 6 oz (51.4 kg)    BP (!) 82/60   Pulse 72   Temp 97.8 F (36.6 C) (Temporal)   Wt 115 lb (52.2 kg)   LMP 09/04/2022 (Exact Date)   SpO2 99%   BMI 21.73 kg/m    Physical Exam Constitutional:      General: She is not in acute distress.    Appearance: She is well-developed. She is not diaphoretic.  HENT:     Right Ear: External ear normal.     Left Ear: External ear normal.     Nose: Nose normal.  Eyes:     Conjunctiva/sclera: Conjunctivae normal.  Cardiovascular:     Rate and Rhythm: Normal rate.  Pulmonary:     Effort: Pulmonary effort is normal.  Musculoskeletal:     Cervical back: Neck supple.  Skin:    General: Skin is warm and dry.     Capillary Refill: Capillary refill takes less than 2 seconds.     Comments: Right inguinal area - small non-fluctuant cord like subdural structure, soft, mobile  Neurological:     Mental Status: She is alert. Mental status is at baseline.  Psychiatric:        Mood and Affect: Mood normal.        Behavior: Behavior normal.           Assessment & Plan:   Problem List Items Addressed This Visit       Other   Lump in the groin - Primary    Etiology unclear - cyst vs lymph node. No infectious symptoms. Discussed GYN referral vs eval with [place] and will get [place].        Relevant Orders   US Pelvis Limited     Return if symptoms worsen or fail to  improve.  Lesleigh Noe, MD

## 2022-09-17 NOTE — Assessment & Plan Note (Signed)
Etiology unclear - cyst vs lymph node. No infectious symptoms. Discussed GYN referral vs eval with [place] and will get [place].

## 2022-09-18 ENCOUNTER — Ambulatory Visit
Admission: RE | Admit: 2022-09-18 | Discharge: 2022-09-18 | Disposition: A | Discharge status: Home / Self Care | Payer: BC Managed Care – PPO | Source: Ambulatory Visit | Attending: Family Medicine | Admitting: Family Medicine

## 2022-09-18 DIAGNOSIS — R1909 Other intra-abdominal and pelvic swelling, mass and lump: Secondary | ICD-10-CM | POA: Diagnosis not present

## 2022-10-02 ENCOUNTER — Encounter: Payer: Self-pay | Admitting: Family Medicine

## 2022-10-02 DIAGNOSIS — R1909 Other intra-abdominal and pelvic swelling, mass and lump: Secondary | ICD-10-CM

## 2022-10-02 DIAGNOSIS — L723 Sebaceous cyst: Secondary | ICD-10-CM

## 2022-10-12 ENCOUNTER — Ambulatory Visit: Payer: Self-pay | Admitting: Surgery

## 2022-10-12 DIAGNOSIS — L723 Sebaceous cyst: Secondary | ICD-10-CM | POA: Diagnosis not present

## 2022-10-12 NOTE — H&P (Signed)
  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  This has been present for about 3 months, and has slowly increased in size.  Has not noted any drainage, no prior procedures in this area.  She is interested in having this removed.   Review of Systems: A complete review of systems was obtained from the patient.  I have reviewed this information and discussed as appropriate with the patient.  See HPI as well for other ROS.   Medical History: History reviewed. No pertinent past medical history.  There is no problem list on file for this patient.   History reviewed. No pertinent surgical history.   No Known Allergies  Current Outpatient Medications on File Prior to Visit  Medication Sig Dispense Refill   desogestreL-ethinyl estradioL (APRI) 0.15-0.03 mg tablet Take 1 tablet by mouth once daily     No current facility-administered medications on file prior to visit.    Family History  Problem Relation Age of Onset   High blood pressure (Hypertension) Mother    Hyperlipidemia (Elevated cholesterol) Father      Social History   Tobacco Use  Smoking Status Never  Smokeless Tobacco Never     Social History   Socioeconomic History   Marital status: Single  Tobacco Use   Smoking status: Never   Smokeless tobacco: Never  Substance and Sexual Activity   Alcohol use: Yes   Drug use: Never    Objective:    Vitals:   10/12/22 1525  BP: 100/80  Pulse: 73  Temp: 36.7 C (98 F)  SpO2: 98%  Weight: 50.3 kg (111 lb)  Height: 154.9 cm (_0 )    Body mass index is 20.97 kg/m.  Alert and well-appearing Unlabored respirations On the very proximal medial right thigh just lateral to the groin crease is an approximately 2 x 1 cm fluctuant subcutaneous  cyst consistent with an abscess versus sebaceous cyst.  Assessment and Plan:  Diagnoses and all orders for this visit:  Sebaceous cyst    We discussed excision under MAC.  Discussed the technique and risks of bleeding, infection, pain, scarring, wound healing problems, recurrence.  Proceed with scheduling.   [name] Raquel James, MD

## 2022-11-08 NOTE — Patient Instructions (Addendum)
SURGICAL WAITING ROOM VISITATION Patients having surgery or a procedure may have no more than 2 support people in the waiting area - these visitors may rotate in the visitor waiting room.   Children under the age of 12 must have an adult with them who is not the patient. If the patient needs to stay at the hospital during part of their recovery, the visitor guidelines for inpatient rooms apply.  PRE-OP VISITATION  Pre-op nurse will coordinate an appropriate time for 1 support person to accompany the patient in pre-op.  This support person may not rotate.  This visitor will be contacted when the time is appropriate for the visitor to come back in the pre-op area.  Please refer to the [facility] website for the visitor guidelines for Inpatients (after your surgery is over and you are in a regular room).  You are not required to quarantine at this time prior to your surgery. However, you must do this: Hand Hygiene often Do NOT share personal items Notify your provider if you are in close contact with someone who has COVID or you develop fever 100.4 or greater, new onset of sneezing, cough, sore throat, shortness of breath or body aches.  If you test positive for Covid or have been in contact with anyone that has tested positive in the last 10 days please notify you surgeon.    Your procedure is scheduled on:  Wednesday  November 14, 2022  Report to [facility] Main Entrance: [facility]sen entrance where the [employer] is available.   Report to admitting at: 11:45 AM  +++++Call this number if you have any questions or problems the morning of surgery [phone]  Do not eat or drink anything after Midnight the night prior to your surgery/procedure.    Oral Hygiene is also important to reduce your risk of infection.        Remember - BRUSH YOUR TEETH THE MORNING OF SURGERY WITH YOUR REGULAR TOOTHPASTE  Take ONLY these medicines the morning of surgery with A SIP OF WATER: none                   You may not have any metal on your body including hair pins, jewelry, and body piercing  Do not wear make-up, lotions, powders, perfumes or deodorant  Do not wear nail polish including gel and S&S, artificial / acrylic nails, or any other type of covering on natural nails including finger and toenails. If you have artificial nails, gel coating, etc., that needs to be removed by a nail salon, Please have this removed prior to surgery. Not doing so may mean that your surgery could be cancelled or delayed if the Surgeon or anesthesia staff feels like they are unable to monitor you safely.   Do not shave 48 hours prior to surgery to avoid nicks in your skin which may contribute to postoperative infections.   Patients discharged on the day of surgery will not be allowed to drive home.  Someone NEEDS to stay with you for the first 24 hours after anesthesia.  Do not bring your home medications to the hospital. The Pharmacy will dispense medications listed on your medication list to you during your admission in the Hospital.  Please read over the following fact sheets you were given: IF YOU HAVE QUESTIONS ABOUT YOUR PRE-OP INSTRUCTIONS, PLEASE CALL [phone]  (KAY)   [place] - Preparing for Surgery Before surgery, you can play an important role.  Because skin is  not sterile, your skin needs to be as free of germs as possible.  You can reduce the number of germs on your skin by washing with CHG (chlorahexidine gluconate) soap before surgery.  CHG is an antiseptic cleaner which kills germs and bonds with the skin to continue killing germs even after washing. Please DO NOT use if you have an allergy to CHG or antibacterial soaps.  If your skin becomes reddened/irritated stop using the CHG and inform your nurse when you arrive at Short Stay. Do not shave (including legs and underarms) for at least 48 hours prior to the first CHG shower.  You may shave your face/neck.  Please follow these  instructions carefully:  1.  Shower with CHG Soap the night before surgery and the  morning of surgery.  2.  If you choose to wash your hair, wash your hair first as usual with your normal  shampoo.  3.  After you shampoo, rinse your hair and body thoroughly to remove the shampoo.                             4.  Use CHG as you would any other liquid soap.  You can apply chg directly to the skin and wash.  Gently with a scrungie or clean washcloth.  5.  Apply the CHG Soap to your body ONLY FROM THE NECK DOWN.   Do not use on face/ open                           Wound or open sores. Avoid contact with eyes, ears mouth and genitals (private parts).                       Wash face,  Genitals (private parts) with your normal soap.             6.  Wash thoroughly, paying special attention to the area where your  surgery  will be performed.  7.  Thoroughly rinse your body with warm water from the neck down.  8.  DO NOT shower/wash with your normal soap after using and rinsing off the CHG Soap.            9.  Pat yourself dry with a clean towel.            10.  Wear clean pajamas.            11.  Place clean sheets on your bed the night of your first shower and do not  sleep with pets.  ON THE DAY OF SURGERY : Do not apply any lotions/deodorants the morning of surgery.  Please wear clean clothes to the hospital/surgery center.    FAILURE TO FOLLOW THESE INSTRUCTIONS MAY RESULT IN THE CANCELLATION OF YOUR SURGERY  PATIENT SIGNATURE_________________________________  NURSE SIGNATURE__________________________________  ________________________________________________________________________

## 2022-11-08 NOTE — Progress Notes (Addendum)
COVID Vaccine received:  _0  No _1  Yes Date of any COVID positive Test in last 90 days:  None  PCP - Waunita Schooner, MD Cardiologist - none  Chest x-ray -  EKG -   Stress Test -  ECHO -  Cardiac Cath -   PCR screen: _2  Ordered & Completed                      _3   No Order but Needs PROFEND                      _4   N/A for this surgery  Surgery Plan:  _5  Ambulatory                            _6  Outpatient in bed                            _7  Admit  Anesthesia:    _8  General  _9  Spinal                           _10   Choice _11   MAC  Bowel Prep - _12  No  _13   Yes _____________  Pacemaker / ICD device _14  No _15  Yes        Device order form faxed _16  No    _17   Yes      Faxed to:  Spinal Cord Stimulator:_18  No _19  Yes      (Remind patient to bring remote DOS) Other Implants:   History of Sleep Apnea? _20  No _21  Yes   CPAP used?- _22  No _23  Yes    Does the patient monitor blood sugar? _24  No _25  Yes  _26  N/A  Blood Thinner / Instructions: none Aspirin Instructions:  ERAS Protocol Ordered: _27  No  _28  Yes PRE-SURGERY _29  ENSURE  _30  G2  _31  No Drink Ordered  Patient is to be NPO after: midnight prior   Comments: No pertinent medical or surgical history. No laboratory testing ordered.   Activity level: Patient can climb a flight of stairs without difficulty; _32  No CP  _33  No SOB.  Patient can perform ADLs without assistance.   Anesthesia review: no pertinent hx. On no medications, no surgical hx except for wisdom teeth removed.   Patient denies shortness of breath, fever, cough and chest pain at PAT appointment.  Patient verbalized understanding and agreement to the Pre-Surgical Instructions that were given to them at this PAT appointment. Patient was also educated of the need to review these PAT instructions again prior to his/her surgery.I reviewed the appropriate phone numbers to call if they have any and questions or concerns.

## 2022-11-09 ENCOUNTER — Encounter (HOSPITAL_COMMUNITY): Payer: Self-pay

## 2022-11-09 ENCOUNTER — Other Ambulatory Visit: Payer: Self-pay

## 2022-11-09 ENCOUNTER — Encounter (HOSPITAL_COMMUNITY)
Admission: RE | Admit: 2022-11-09 | Discharge: 2022-11-09 | Disposition: A | Discharge status: Home / Self Care | Payer: BC Managed Care – PPO | Source: Ambulatory Visit | Attending: Surgery | Admitting: Surgery

## 2022-11-09 VITALS — BP 112/80 | HR 62 | Temp 98.4°F | Resp 14 | Ht 61.0 in | Wt 111.0 lb

## 2022-11-09 DIAGNOSIS — Z01818 Encounter for other preprocedural examination: Secondary | ICD-10-CM

## 2022-11-09 DIAGNOSIS — Z01812 Encounter for preprocedural laboratory examination: Secondary | ICD-10-CM | POA: Diagnosis not present

## 2022-11-09 HISTORY — DX: Other specified health status: Z78.9

## 2022-11-09 HISTORY — DX: Pneumonia, unspecified organism: J18.9

## 2022-11-14 ENCOUNTER — Ambulatory Visit (HOSPITAL_COMMUNITY)
Admission: RE | Admit: 2022-11-14 | Discharge: 2022-11-14 | Disposition: A | Discharge status: Home / Self Care | Payer: BC Managed Care – PPO | Attending: Surgery | Admitting: Surgery

## 2022-11-14 ENCOUNTER — Encounter (HOSPITAL_COMMUNITY)
Admission: RE | Disposition: A | Discharge status: Home / Self Care | Payer: Self-pay | Source: Home / Self Care | Attending: Surgery

## 2022-11-14 ENCOUNTER — Ambulatory Visit (HOSPITAL_COMMUNITY): Payer: BC Managed Care – PPO | Admitting: Physician Assistant

## 2022-11-14 ENCOUNTER — Other Ambulatory Visit: Payer: Self-pay

## 2022-11-14 ENCOUNTER — Ambulatory Visit (HOSPITAL_COMMUNITY): Payer: BC Managed Care – PPO | Admitting: Anesthesiology

## 2022-11-14 ENCOUNTER — Encounter (HOSPITAL_COMMUNITY): Payer: Self-pay | Admitting: Surgery

## 2022-11-14 DIAGNOSIS — Z01818 Encounter for other preprocedural examination: Secondary | ICD-10-CM

## 2022-11-14 DIAGNOSIS — L928 Other granulomatous disorders of the skin and subcutaneous tissue: Secondary | ICD-10-CM | POA: Diagnosis not present

## 2022-11-14 DIAGNOSIS — L723 Sebaceous cyst: Secondary | ICD-10-CM | POA: Insufficient documentation

## 2022-11-14 DIAGNOSIS — L02415 Cutaneous abscess of right lower limb: Secondary | ICD-10-CM | POA: Diagnosis not present

## 2022-11-14 DIAGNOSIS — L729 Follicular cyst of the skin and subcutaneous tissue, unspecified: Secondary | ICD-10-CM | POA: Diagnosis not present

## 2022-11-14 DIAGNOSIS — L309 Dermatitis, unspecified: Secondary | ICD-10-CM | POA: Diagnosis not present

## 2022-11-14 HISTORY — PX: CYST EXCISION: SHX5701

## 2022-11-14 LAB — POCT PREGNANCY, URINE
Preg Test, Ur: NEGATIVE
Preg Test, Ur: NEGATIVE

## 2022-11-14 SURGERY — CYST REMOVAL
Anesthesia: Monitor Anesthesia Care | Site: Thigh | Laterality: Right

## 2022-11-14 MED ORDER — CHLORHEXIDINE GLUCONATE 4 % EX LIQD: 60.0000 mL | Freq: Once | CUTANEOUS | Status: DC

## 2022-11-14 MED ORDER — GLYCOPYRROLATE 0.2 MG/ML IJ SOLN
INTRAMUSCULAR | Status: DC | PRN
  Administered 2022-11-14: .2 mg via INTRAVENOUS

## 2022-11-14 MED ORDER — GABAPENTIN 300 MG PO CAPS
300.0000 mg | ORAL_CAPSULE | ORAL | Status: DC
  Filled 2022-11-14: qty 1

## 2022-11-14 MED ORDER — ORAL CARE MOUTH RINSE: 15.0000 mL | Freq: Once | OROMUCOSAL | Status: AC

## 2022-11-14 MED ORDER — ACETAMINOPHEN 500 MG PO TABS
1000.0000 mg | ORAL_TABLET | ORAL | Status: AC
  Administered 2022-11-14: 1000 mg via ORAL
  Filled 2022-11-14: qty 2

## 2022-11-14 MED ORDER — AMISULPRIDE (ANTIEMETIC) 5 MG/2ML IV SOLN: 10.0000 mg | Freq: Once | INTRAVENOUS | Status: DC | PRN

## 2022-11-14 MED ORDER — LACTATED RINGERS IV SOLN: INTRAVENOUS | Status: DC

## 2022-11-14 MED ORDER — 0.9 % SODIUM CHLORIDE (POUR BTL) OPTIME
TOPICAL | Status: DC | PRN
  Administered 2022-11-14: 1000 mL

## 2022-11-14 MED ORDER — PROMETHAZINE HCL 25 MG/ML IJ SOLN: 6.2500 mg | INTRAMUSCULAR | Status: DC | PRN

## 2022-11-14 MED ORDER — ONDANSETRON HCL 4 MG/2ML IJ SOLN
INTRAMUSCULAR | Status: DC | PRN
  Administered 2022-11-14: 4 mg via INTRAVENOUS

## 2022-11-14 MED ORDER — FENTANYL CITRATE (PF) 100 MCG/2ML IJ SOLN
INTRAMUSCULAR | Status: DC | PRN
  Administered 2022-11-14: 25 ug via INTRAVENOUS
  Administered 2022-11-14: 50 ug via INTRAVENOUS
  Administered 2022-11-14: 25 ug via INTRAVENOUS

## 2022-11-14 MED ORDER — OXYCODONE HCL 5 MG PO TABS: 5.0000 mg | ORAL_TABLET | Freq: Once | ORAL | Status: DC | PRN

## 2022-11-14 MED ORDER — BUPIVACAINE-EPINEPHRINE 0.5% -1:200000 IJ SOLN
INTRAMUSCULAR | Status: DC | PRN
  Administered 2022-11-14: 6 mL

## 2022-11-14 MED ORDER — FENTANYL CITRATE PF 50 MCG/ML IJ SOSY: 25.0000 ug | PREFILLED_SYRINGE | INTRAMUSCULAR | Status: DC | PRN

## 2022-11-14 MED ORDER — MIDAZOLAM HCL 2 MG/2ML IJ SOLN
INTRAMUSCULAR | Status: AC
  Filled 2022-11-14: qty 2

## 2022-11-14 MED ORDER — CHLORHEXIDINE GLUCONATE 0.12 % MT SOLN
15.0000 mL | Freq: Once | OROMUCOSAL | Status: AC
  Administered 2022-11-14: 15 mL via OROMUCOSAL

## 2022-11-14 MED ORDER — KETOROLAC TROMETHAMINE 30 MG/ML IJ SOLN: 30.0000 mg | Freq: Once | INTRAMUSCULAR | Status: DC | PRN

## 2022-11-14 MED ORDER — DEXMEDETOMIDINE HCL IN NACL 80 MCG/20ML IV SOLN
INTRAVENOUS | Status: DC | PRN
  Administered 2022-11-14 (×5): 4 ug via BUCCAL

## 2022-11-14 MED ORDER — MIDAZOLAM HCL 5 MG/5ML IJ SOLN
INTRAMUSCULAR | Status: DC | PRN
  Administered 2022-11-14: 2 mg via INTRAVENOUS

## 2022-11-14 MED ORDER — BUPIVACAINE LIPOSOME 1.3 % IJ SUSP
INTRAMUSCULAR | Status: AC
  Filled 2022-11-14: qty 20

## 2022-11-14 MED ORDER — BUPIVACAINE-EPINEPHRINE (PF) 0.5% -1:200000 IJ SOLN
INTRAMUSCULAR | Status: AC
  Filled 2022-11-14: qty 30

## 2022-11-14 MED ORDER — FENTANYL CITRATE (PF) 100 MCG/2ML IJ SOLN
INTRAMUSCULAR | Status: AC
  Filled 2022-11-14: qty 2

## 2022-11-14 MED ORDER — PROPOFOL 500 MG/50ML IV EMUL
INTRAVENOUS | Status: DC | PRN
  Administered 2022-11-14: 20 mg via INTRAVENOUS
  Administered 2022-11-14: 50 ug/kg/min via INTRAVENOUS
  Administered 2022-11-14 (×2): 20 mg via INTRAVENOUS
  Administered 2022-11-14: 30 mg via INTRAVENOUS

## 2022-11-14 MED ORDER — CEFAZOLIN SODIUM-DEXTROSE 2-4 GM/100ML-% IV SOLN
2.0000 g | INTRAVENOUS | Status: AC
  Administered 2022-11-14: 2 g via INTRAVENOUS
  Filled 2022-11-14: qty 100

## 2022-11-14 MED ORDER — OXYCODONE HCL 5 MG/5ML PO SOLN: 5.0000 mg | Freq: Once | ORAL | Status: DC | PRN

## 2022-11-14 MED ORDER — TRAMADOL HCL 50 MG PO TABS: 50.0000 mg | ORAL_TABLET | Freq: Four times a day (QID) | ORAL | 0 refills | Status: AC | PRN

## 2022-11-14 SURGICAL SUPPLY — 29 items
BAG COUNTER SPONGE SURGICOUNT (BAG) IMPLANT
BLADE SURG SZ10 CARB STEEL (BLADE) ×1 IMPLANT
BNDG GAUZE DERMACEA FLUFF 4 (GAUZE/BANDAGES/DRESSINGS) IMPLANT
CHLORAPREP W/TINT 26 (MISCELLANEOUS) ×1 IMPLANT
COVER SURGICAL LIGHT HANDLE (MISCELLANEOUS) ×1 IMPLANT
DERMABOND ADVANCED .7 DNX12 (GAUZE/BANDAGES/DRESSINGS) IMPLANT
DRAIN PENROSE 0.25X18 (DRAIN) IMPLANT
DRAPE LAPAROTOMY TRNSV 102X78 (DRAPES) ×1 IMPLANT
ELECT REM PT RETURN 15FT ADLT (MISCELLANEOUS) ×1 IMPLANT
GAUZE 4X4 16PLY ~~LOC~~+RFID DBL (SPONGE) ×1 IMPLANT
GAUZE PAD ABD 8X10 STRL (GAUZE/BANDAGES/DRESSINGS) IMPLANT
GAUZE SPONGE 4X4 12PLY STRL (GAUZE/BANDAGES/DRESSINGS) IMPLANT
GLOVE BIO SURGEON STRL SZ 6 (GLOVE) ×1 IMPLANT
GLOVE INDICATOR 6.5 STRL GRN (GLOVE) ×1 IMPLANT
GLOVE SS BIOGEL STRL SZ 6 (GLOVE) ×1 IMPLANT
GOWN STRL REUS W/ TWL LRG LVL3 (GOWN DISPOSABLE) ×1 IMPLANT
GOWN STRL REUS W/ TWL XL LVL3 (GOWN DISPOSABLE) IMPLANT
GOWN STRL REUS W/TWL LRG LVL3 (GOWN DISPOSABLE) ×1
GOWN STRL REUS W/TWL XL LVL3 (GOWN DISPOSABLE)
KIT BASIN OR (CUSTOM PROCEDURE TRAY) ×1 IMPLANT
KIT TURNOVER KIT A (KITS) IMPLANT
MARKER SKIN DUAL TIP RULER LAB (MISCELLANEOUS) ×1 IMPLANT
NEEDLE HYPO 22GX1.5 SAFETY (NEEDLE) IMPLANT
PACK GENERAL/GYN (CUSTOM PROCEDURE TRAY) ×1 IMPLANT
SPIKE FLUID TRANSFER (MISCELLANEOUS) ×1 IMPLANT
SUT MNCRL AB 4-0 PS2 18 (SUTURE) ×1 IMPLANT
SYR CONTROL 10ML LL (SYRINGE) IMPLANT
TOWEL OR 17X26 10 PK STRL BLUE (TOWEL DISPOSABLE) ×1 IMPLANT
TOWEL OR NON WOVEN STRL DISP B (DISPOSABLE) ×1 IMPLANT

## 2022-11-14 NOTE — H&P (Signed)
  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  This has been present for about 3 months, and has slowly increased in size.  Has not noted any drainage, no prior procedures in this area.  She is interested in having this removed.     Review of Systems: A complete review of systems was obtained from the patient.  I have reviewed this information and discussed as appropriate with the patient.  See HPI as well for other ROS.     Medical History: History reviewed. No pertinent past medical history.   There is no problem list on file for this patient.     History reviewed. No pertinent surgical history.    No Known Allergies         Current Outpatient Medications on File Prior to Visit  Medication Sig Dispense Refill   desogestreL-ethinyl estradioL (APRI) 0.15-0.03 mg tablet Take 1 tablet by mouth once daily        No current facility-administered medications on file prior to visit.           Family History  Problem Relation Age of Onset   High blood pressure (Hypertension) Mother     Hyperlipidemia (Elevated cholesterol) Father        Social History       Tobacco Use  Smoking Status Never  Smokeless Tobacco Never      Social History        Socioeconomic History   Marital status: Single  Tobacco Use   Smoking status: Never   Smokeless tobacco: Never  Substance and Sexual Activity   Alcohol use: Yes   Drug use: Never      Objective:         Vitals:    10/12/22 1525  BP: 100/80  Pulse: 73  Temp: 36.7 C (98 F)  SpO2: 98%  Weight: 50.3 kg (111 lb)  Height: 154.9 cm (_0 )    Body mass index is 20.97 kg/m.   Alert and well-appearing Unlabored respirations On the very proximal medial right thigh just lateral to the groin  crease is an approximately 2 x 1 cm fluctuant subcutaneous cyst consistent with an abscess versus sebaceous cyst.   Assessment and Plan:  Diagnoses and all orders for this visit:   Sebaceous cyst     We discussed excision under MAC.  Discussed the technique and risks of bleeding, infection, pain, scarring, wound healing problems, recurrence.  Proceed with scheduling.     Cedric Denison Raquel James, MD

## 2022-11-14 NOTE — Anesthesia Preprocedure Evaluation (Addendum)
Anesthesia Evaluation  Patient identified by MRN, date of birth, ID band Patient awake    Reviewed: Allergy & Precautions, NPO status , Patient's Chart, lab work & pertinent test results  Airway Mallampati: I  TM Distance: >3 FB Neck ROM: Full    Dental no notable dental hx.    Pulmonary neg pulmonary ROS   Pulmonary exam normal        Cardiovascular negative cardio ROS Normal cardiovascular exam     Neuro/Psych negative neurological ROS  negative psych ROS   GI/Hepatic negative GI ROS, Neg liver ROS,,,  Endo/Other  negative endocrine ROS    Renal/GU negative Renal ROS     Musculoskeletal negative musculoskeletal ROS (+)    Abdominal   Peds  Hematology negative hematology ROS (+)   Anesthesia Other Findings SUBCUTANEOUS CYST  Reproductive/Obstetrics                              Anesthesia Physical Anesthesia Plan  ASA: 1  Anesthesia Plan: MAC   Post-op Pain Management:    Induction: Intravenous  PONV Risk Score and Plan: 2 and Ondansetron, Dexamethasone, Propofol infusion, Midazolam and Treatment may vary due to age or medical condition  Airway Management Planned: Simple Face Mask  Additional Equipment:   Intra-op Plan:   Post-operative Plan:   Informed Consent: I have reviewed the patients History and Physical, chart, labs and discussed the procedure including the risks, benefits and alternatives for the proposed anesthesia with the patient or authorized representative who has indicated his/her understanding and acceptance.     Dental advisory given  Plan Discussed with: CRNA  Anesthesia Plan Comments:         Anesthesia Quick Evaluation

## 2022-11-14 NOTE — Discharge Instructions (Addendum)
GENERAL SURGERY: POST OP INSTRUCTIONS  EAT Gradually transition to a high fiber diet with a fiber supplement over the next few weeks after discharge.  Start with a pureed / full liquid diet (see below)  WALK Walk an hour a day (cumulative, not all at once).  Control your pain to do that.    CONTROL PAIN Control pain so that you can walk, sleep, tolerate sneezing/coughing, go up/down stairs.  HAVE A BOWEL MOVEMENT DAILY Keep your bowels regular to avoid problems.  OK to try a laxative to override constipation.  OK to use an antidairrheal to slow down diarrhea.  Call if not better after 2 tries  CALL IF YOU HAVE PROBLEMS/CONCERNS Call if you are still struggling despite following these instructions. Call if you have concerns not answered by these instructions    DIET: Follow a light bland diet & liquids the first 24 hours after arrival home, such as soup, liquids, starches, etc.  Be sure to drink plenty of fluids.  Quickly advance to a usual solid diet within a few days.  Avoid fast food or heavy meals as your are more likely to get nauseated or have irregular bowels.  A low-sugar, high-fiber diet for the rest of your life is ideal.   Take your usually prescribed home medications unless otherwise directed. PAIN CONTROL: Pain is best controlled by a usual combination of three different methods TOGETHER: Ice/Heat Over the counter pain medication Prescription pain medication Most patients will experience some swelling and bruising around the incisions.  Ice packs or heating pads (30-60 minutes up to 6 times a day) will help. Use ice for the first few days to help decrease swelling and bruising, then switch to heat to help relax tight/sore spots and speed recovery.  Some people prefer to use ice alone, heat alone, alternating between ice & heat.  Experiment to what works for you.  Swelling and bruising can take several weeks to resolve.   It is helpful to take an over-the-counter pain  medication regularly for the first few weeks.  Choose one of the following that works best for you: Naproxen (Aleve, etc)  Two 220mg  tabs twice a day OR Ibuprofen (Advil, etc) Three 200mg  tabs four times a day (every meal & bedtime) AND Acetaminophen (Tylenol, etc) 500-650mg  four times a day (every meal & bedtime) A  prescription for pain medication (such as oxycodone, hydrocodone, etc) should be given to you upon discharge.  Take your pain medication as prescribed.  If you are having problems/concerns with the prescription medicine (does not control pain, nausea, vomiting, rash, itching, etc), please call us [phone] to see if we need to switch you to a different pain medicine that will work better for you and/or control your side effect better. If you need a refill on your pain medication, please contact your pharmacy.  They will contact our office to request authorization. Prescriptions will not be filled after 5 pm or on week-ends. Avoid getting constipated.  Between the surgery and the pain medications, it is common to experience some constipation.  Increasing fluid intake and taking a fiber supplement (such as Metamucil, Citrucel, FiberCon, MiraLax, etc) 1-2 times a day regularly will usually help prevent this problem from occurring.  A mild laxative (prune juice, Milk of Magnesia, MiraLax, etc) should be taken according to package directions if there are no bowel movements after 48 hours.   Wash / shower every day, starting 2 days after surgery.  You may shower over the  steri strips or skin glue as they are waterproof.  Continue to shower over incision(s) after the dressing is off. Steri strips or glue will peel off after 1-2 weeks.  You may leave the incision open to air.  You may replace a dressing/Band-Aid to cover the incision for comfort if you wish.    ACTIVITIES as tolerated:   You may resume regular (light) daily activities beginning the next day--such as daily self-care, walking,  climbing stairs--gradually increasing activities as tolerated.  If you can walk 30 minutes without difficulty, it is safe to try more intense activity such as jogging, treadmill, bicycling, low-impact aerobics, swimming, etc. Save the most intensive and strenuous activity for last such as sit-ups, heavy lifting, contact sports, etc  Refrain from any heavy lifting or straining until you are off narcotics for pain control.   DO NOT PUSH THROUGH PAIN.  Let pain be your guide: If it hurts to do something, don't do it.  Pain is your body warning you to avoid that activity for another week until the pain goes down. You may drive when you are no longer taking prescription pain medication, you can comfortably wear a seatbelt, and you can safely maneuver your car and apply brakes. You may have sexual intercourse when it is comfortable.  FOLLOW UP in our office Please call [facility] at [phone] to set up an appointment to see your surgeon in the office for a follow-up appointment approximately 2-3 weeks after your surgery. Make sure that you call for this appointment the day you arrive home to insure a convenient appointment time. 9. IF YOU HAVE DISABILITY OR FAMILY LEAVE FORMS, BRING THEM TO THE OFFICE FOR PROCESSING.  DO NOT GIVE THEM TO YOUR DOCTOR.   WHEN TO CALL us [phone]: Poor pain control Reactions / problems with new medications (rash/itching, nausea, etc)  Fever over 101.5 F (38.5 C) Worsening swelling or bruising Continued bleeding from incision. Increased pain, redness, or drainage from the incision Difficulty breathing / swallowing   The clinic staff is available to answer your questions during regular business hours (8:30am-5pm).  Please don't hesitate to call and ask to speak to one of our nurses for clinical concerns.   If you have a medical emergency, go to the nearest emergency room or call 911.  A surgeon from [facility] is always on call at the  [facility], [place] [address], [place], [place]  [address] ? MAIN: [phone] ? TOLL FREE: [phone] ?  FAX [phone] [email or web address]

## 2022-11-14 NOTE — Op Note (Signed)
Operative Note  Michele Poole  [id number]  [id number]  11/14/2022   Surgeon: Romana Juniper MD FACS I performed the key and critical portions of this procedure and was scrubbed throughout the entire procedure, as documented in my operative note.    Assistant: Winfield Rast MD PGY3   Procedure performed: Excision of 1 x 3 cm subcutaneous cyst, right proximal medial thigh   Preop diagnosis: Subcutaneous cyst Post-op diagnosis/intraop findings: Same, cord extending superior medially from the skin lesion   Specimens: Subcutaneous cyst Retained items: no  EBL: Minimal cc Complications: none   Description of procedure: After obtaining informed consent the patient was taken to the operating room and placed supine on operating room table where MAC was initiated, preoperative antibiotics were administered, SCDs applied, and a formal timeout was performed.  The right proximal medial thigh and groin were prepped in the usual sterile fashion.  After infiltration with local, an elliptical incision was made encompassing the chronic scar and granulation tissue overlying the region of the cyst.  The soft tissue was then dissected with cautery, excising the subcutaneous lesion and associated subcutaneous cord that extended about 2 cm cephalad and medial.  This was handed off for pathology.  The wound was inspected and hemostasis ensured with cautery and direct pressure.  The incision was closed with interrupted deep dermal 3-0 Vicryl followed by running subcuticular 4 Monocryl and Dermabond.  The patient was then awakened and taken to PACU in stable condition.    All counts were correct at the completion of the case.

## 2022-11-14 NOTE — Anesthesia Postprocedure Evaluation (Signed)
Anesthesia Post Note  Patient: Michele Poole. [place]  Procedure(s) Performed: EXCISION OF SUBCUTANEOUS CYST RIGHT MEDIAL THIGH (Right: Thigh)     Patient location during evaluation: PACU Anesthesia Type: MAC Level of consciousness: awake Pain management: pain level controlled Vital Signs Assessment: post-procedure vital signs reviewed and stable Respiratory status: spontaneous breathing, nonlabored ventilation, respiratory function stable and patient connected to nasal cannula oxygen Cardiovascular status: stable and blood pressure returned to baseline Postop Assessment: no apparent nausea or vomiting Anesthetic complications: no   No notable events documented.  Last Vitals:  Vitals:   11/14/22 1430 11/14/22 1435  BP: 100/68 105/73  Pulse: 81 77  Resp: 16 16  Temp:  36.6 C  SpO2: 100% 99%    Last Pain:  Vitals:   11/14/22 1435  TempSrc:   PainSc: 0-No pain                 Michele Poole

## 2022-11-14 NOTE — Transfer of Care (Signed)
Immediate Anesthesia Transfer of Care Note  Patient: Michele Poole. [name]  Procedure(s) Performed: EXCISION OF SUBCUTANEOUS CYST RIGHT MEDIAL THIGH (Right: Thigh)  Patient Location: PACU  Anesthesia Type:MAC  Level of Consciousness: drowsy  Airway & Oxygen Therapy: Patient Spontanous Breathing and Patient connected to nasal cannula oxygen  Post-op Assessment: Report given to RN and Post -op Vital signs reviewed and stable  Post vital signs: Reviewed and stable  Last Vitals:  Vitals Value Taken Time  BP 83/52 11/14/22 1400  Temp 36.5 C 11/14/22 1400  Pulse 77 11/14/22 1402  Resp 11 11/14/22 1402  SpO2 99 % 11/14/22 1402  Vitals shown include unvalidated device data.  Last Pain:  Vitals:   11/14/22 1400  TempSrc:   PainSc: Asleep         Complications: No notable events documented.

## 2022-11-15 ENCOUNTER — Encounter (HOSPITAL_COMMUNITY): Payer: Self-pay | Admitting: Surgery

## 2022-11-15 LAB — SURGICAL PATHOLOGY

## 2023-03-20 ENCOUNTER — Encounter: Payer: Self-pay | Admitting: Family

## 2023-03-20 ENCOUNTER — Ambulatory Visit: Payer: BC Managed Care – PPO | Admitting: Family

## 2023-03-20 VITALS — BP 102/66 | HR 92 | Temp 97.9°F | Ht 61.0 in | Wt 115.2 lb

## 2023-03-20 DIAGNOSIS — Z7689 Persons encountering health services in other specified circumstances: Secondary | ICD-10-CM | POA: Diagnosis not present

## 2023-03-20 DIAGNOSIS — J302 Other seasonal allergic rhinitis: Secondary | ICD-10-CM | POA: Diagnosis not present

## 2023-03-20 DIAGNOSIS — Z23 Encounter for immunization: Secondary | ICD-10-CM | POA: Diagnosis not present

## 2023-03-20 DIAGNOSIS — Z3041 Encounter for surveillance of contraceptive pills: Secondary | ICD-10-CM

## 2023-03-20 NOTE — Assessment & Plan Note (Signed)
Recommendation for daily flonase and nightly zyrtec at least through allergy season

## 2023-03-20 NOTE — Assessment & Plan Note (Signed)
Continue ocp's.   

## 2023-03-20 NOTE — Assessment & Plan Note (Signed)
Pt established, went over history, social and familial, health maintenance and reviewed labs and prior encounter notes as necessary.

## 2023-03-20 NOTE — Patient Instructions (Signed)
Recommendation for daily flonase and nightly zyrtec through allergy season.    Regards,   Eugenia Pancoast FNP-C

## 2023-03-20 NOTE — Progress Notes (Signed)
Established Patient Office Visit  Subjective:  Patient ID: Michele Poole, female    DOB: 03-10-1996  Age: 27 y.o. MRN: [id number]  CC:  Chief Complaint  Patient presents with   Establish Care    TOC from Dr Einar Pheasant    HPI Michele Poole is here for a transition of care visit.  Prior provider was: Dr. Waunita Schooner    Pt is without acute concerns.   Allergic rhinitis: not taking anything for allergies but with runny nose.   Hpv vaccination had her first one 4/23, due for second , then for third.  Pap, overdue last had 03/04/2019. Will make appt.   chronic concerns:  On birth control, doing well. Regular monthly periods.     Past Medical History:  Diagnosis Date   Medical history non-contributory    Pneumonia    as a child    Past Surgical History:  Procedure Laterality Date   CYST EXCISION Right 11/14/2022   Procedure: EXCISION OF SUBCUTANEOUS CYST RIGHT MEDIAL THIGH;  Surgeon: Clovis Riley, MD;  Location: [facility];  Service: General;  Laterality: Right;   WISDOM TOOTH EXTRACTION      Family History  Problem Relation Age of Onset   Hypertension Mother    Hyperlipidemia Father    Diabetes Paternal Grandmother    Hypertension Paternal Grandmother    Breast cancer Paternal Grandmother 26   Stroke Paternal Grandmother 33    Social History   Socioeconomic History   Marital status: Single    Spouse name: Not on file   Number of children: Not on file   Years of education: associates   Highest education level: Not on file  Occupational History   Occupation: Freight forwarder    Comment: [employer]  Tobacco Use   Smoking status: Never   Smokeless tobacco: Never  Vaping Use   Vaping Use: Never used  Substance and Sexual Activity   Alcohol use: Yes    Comment: monthy, 1-2 drinks   Drug use: No   Sexual activity: Yes    Partners: Male    Birth control/protection: Pill, OCP  Other Topics Concern   Not on file  Social History Narrative   04/23/22    From: the area   Living: with parents   Work: Freight forwarder at [employer]      Family: has siblings, lives with parents, good relationships      Enjoys: tennis, running      Diet: generally good   Exercise: running      Safety   Seat belts: Yes    Guns: Yes  and secure   Safe in relationships: Yes       Has a s/o has been with him for eight years.    Social Determinants of Health   Financial Resource Strain: Low Risk  (03/04/2019)   Overall Financial Resource Strain (CARDIA)    Difficulty of Paying Living Expenses: Not hard at all  Food Insecurity: No Food Insecurity (03/04/2019)   Hunger Vital Sign    Worried About Running Out of Food in the Last Year: Never true    Ran Out of Food in the Last Year: Never true  Transportation Needs: No Transportation Needs (03/04/2019)   PRAPARE - Hydrologist (Medical): No    Lack of Transportation (Non-Medical): No  Physical Activity: Not on file  Stress: Not on file  Social Connections: Not on file  Intimate Partner Violence: Not on  file    Outpatient Medications Prior to Visit  Medication Sig Dispense Refill   Cholecalciferol (VITAMIN D3 PO) Take 1 capsule by mouth daily.     desogestrel-ethinyl estradiol (APRI) 0.15-30 MG-MCG tablet Take 1 tablet by mouth daily. 84 tablet 3   Coenzyme Q10 (CO Q-10 PO) Take 1 capsule by mouth daily.     MAGNESIUM PO Take 1 tablet by mouth daily.     No facility-administered medications prior to visit.    No Known Allergies  ROS Review of Systems  ROS: Pertinent symptoms negative unless otherwise noted in HPI       Objective:    Physical Exam Vitals reviewed.  Constitutional:      Appearance: Normal appearance.  HENT:     Nose:     Right Turbinates: Enlarged and swollen.     Left Turbinates: Swollen.  Eyes:     General:        Right eye: No discharge.        Left eye: No discharge.     Conjunctiva/sclera: Conjunctivae normal.  Cardiovascular:      Rate and Rhythm: Normal rate and regular rhythm.  Pulmonary:     Effort: Pulmonary effort is normal. No respiratory distress.  Musculoskeletal:        General: Normal range of motion.     Cervical back: Normal range of motion.  Neurological:     General: No focal deficit present.     Mental Status: She is alert and oriented to person, place, and time. Mental status is at baseline.  Psychiatric:        Mood and Affect: Mood normal.        Behavior: Behavior normal.        Thought Content: Thought content normal.        Judgment: Judgment normal.       BP 102/66   Pulse 92   Temp 97.9 F (36.6 C) (Temporal)   Ht 5\' 1"  (1.549 m)   Wt 115 lb 3.2 oz (52.3 kg)   LMP 03/19/2023   SpO2 98%   BMI 21.77 kg/m  Wt Readings from Last 3 Encounters:  03/20/23 115 lb 3.2 oz (52.3 kg)  11/14/22 110 lb 3.7 oz (50 kg)  11/09/22 111 lb (50.3 kg)     Health Maintenance Due  Topic Date Due   HIV Screening  Never done   Hepatitis C Screening  Never done   PAP-Cervical Cytology Screening  03/03/2022   PAP SMEAR-Modifier  03/03/2022   HPV VACCINES (3 - 3-dose series) 06/12/2023       Topic Date Due   HPV VACCINES (3 - 3-dose series) 06/12/2023    No results found for: "TSH" Lab Results  Component Value Date   WBC 5.0 03/04/2019   HGB 14.4 03/04/2019   HCT 43.7 03/04/2019   MCV 87.1 03/04/2019   PLT 238 03/04/2019   Lab Results  Component Value Date   NA 140 03/04/2019   K 3.8 03/04/2019   CO2 27 03/04/2019   GLUCOSE 91 03/04/2019   BUN 13 03/04/2019   CREATININE 0.83 03/04/2019   BILITOT 1.0 03/04/2019   AST 15 03/04/2019   ALT 11 03/04/2019   PROT 7.4 03/04/2019   CALCIUM 9.3 03/04/2019   No results found for: "CHOL" No results found for: "HDL" No results found for: "LDLCALC" No results found for: "TRIG" No results found for: "CHOLHDL" No results found for: "HGBA1C"    Assessment & Plan:  Seasonal allergic rhinitis, unspecified trigger Assessment &  Plan: Recommendation for daily flonase and nightly zyrtec at least through allergy season    Encounter to establish care Assessment & Plan: Pt established, went over history, social and familial, health maintenance and reviewed labs and prior encounter notes as necessary.     Encounter for surveillance of contraceptive pills Assessment & Plan: Continue ocps    Need for HPV vaccination -     HPV 9-valent vaccine,Recombinat    No orders of the defined types were placed in this encounter.   Follow-up: Return in about 4 months (around 07/20/2023) for f/u CPE, f/u PAP.    Eugenia Pancoast, FNP

## 2023-04-17 ENCOUNTER — Encounter: Payer: Self-pay | Admitting: Family

## 2023-04-17 DIAGNOSIS — Z3041 Encounter for surveillance of contraceptive pills: Secondary | ICD-10-CM

## 2023-04-18 MED ORDER — DESOGESTREL-ETHINYL ESTRADIOL 0.15-30 MG-MCG PO TABS: 1.0000 | ORAL_TABLET | Freq: Every day | ORAL | 3 refills | Status: DC

## 2023-04-29 ENCOUNTER — Encounter: Payer: BC Managed Care – PPO | Admitting: Family Medicine

## 2023-07-22 ENCOUNTER — Encounter: Payer: Self-pay | Admitting: Family

## 2023-07-22 ENCOUNTER — Ambulatory Visit (INDEPENDENT_AMBULATORY_CARE_PROVIDER_SITE_OTHER): Payer: BC Managed Care – PPO | Admitting: Family

## 2023-07-22 ENCOUNTER — Other Ambulatory Visit (HOSPITAL_COMMUNITY)
Admission: RE | Admit: 2023-07-22 | Discharge: 2023-07-22 | Disposition: A | Discharge status: Home / Self Care | Payer: BC Managed Care – PPO | Source: Ambulatory Visit | Attending: Family | Admitting: Family

## 2023-07-22 ENCOUNTER — Other Ambulatory Visit: Payer: Self-pay

## 2023-07-22 VITALS — BP 106/56 | HR 59 | Temp 98.0°F | Ht 61.0 in | Wt 114.0 lb

## 2023-07-22 DIAGNOSIS — Z01419 Encounter for gynecological examination (general) (routine) without abnormal findings: Secondary | ICD-10-CM | POA: Insufficient documentation

## 2023-07-22 DIAGNOSIS — Z114 Encounter for screening for human immunodeficiency virus [HIV]: Secondary | ICD-10-CM

## 2023-07-22 DIAGNOSIS — R197 Diarrhea, unspecified: Secondary | ICD-10-CM | POA: Diagnosis not present

## 2023-07-22 DIAGNOSIS — Z13220 Encounter for screening for lipoid disorders: Secondary | ICD-10-CM | POA: Diagnosis not present

## 2023-07-22 DIAGNOSIS — Z1272 Encounter for screening for malignant neoplasm of vagina: Secondary | ICD-10-CM | POA: Diagnosis not present

## 2023-07-22 DIAGNOSIS — Z23 Encounter for immunization: Secondary | ICD-10-CM

## 2023-07-22 DIAGNOSIS — Z1159 Encounter for screening for other viral diseases: Secondary | ICD-10-CM

## 2023-07-22 DIAGNOSIS — L03031 Cellulitis of right toe: Secondary | ICD-10-CM | POA: Insufficient documentation

## 2023-07-22 LAB — CBC WITH DIFFERENTIAL/PLATELET
Basophils Absolute: 0 10*3/uL (ref 0.0–0.1)
Basophils Relative: 0.8 % (ref 0.0–3.0)
Eosinophils Absolute: 0.4 10*3/uL (ref 0.0–0.7)
Eosinophils Relative: 9.9 % — ABNORMAL HIGH (ref 0.0–5.0)
HCT: 45.2 % (ref 36.0–46.0)
Hemoglobin: 14.7 g/dL (ref 12.0–15.0)
Lymphocytes Relative: 39 % (ref 12.0–46.0)
Lymphs Abs: 1.7 10*3/uL (ref 0.7–4.0)
MCHC: 32.5 g/dL (ref 30.0–36.0)
MCV: 89.2 fl (ref 78.0–100.0)
Monocytes Absolute: 0.3 10*3/uL (ref 0.1–1.0)
Monocytes Relative: 7 % (ref 3.0–12.0)
Neutro Abs: 1.9 10*3/uL (ref 1.4–7.7)
Neutrophils Relative %: 43.3 % (ref 43.0–77.0)
Platelets: 215 10*3/uL (ref 150.0–400.0)
RBC: 5.07 Mil/uL (ref 3.87–5.11)
RDW: 12.7 % (ref 11.5–15.5)
WBC: 4.4 10*3/uL (ref 4.0–10.5)

## 2023-07-22 LAB — COMPREHENSIVE METABOLIC PANEL
ALT: 11 U/L (ref 0–35)
AST: 18 U/L (ref 0–37)
Albumin: 4.6 g/dL (ref 3.5–5.2)
Alkaline Phosphatase: 44 U/L (ref 39–117)
BUN: 12 mg/dL (ref 6–23)
CO2: 24 mEq/L (ref 19–32)
Calcium: 9.7 mg/dL (ref 8.4–10.5)
Chloride: 103 mEq/L (ref 96–112)
Creatinine, Ser: 0.99 mg/dL (ref 0.40–1.20)
GFR: 78.74 mL/min (ref >60.00)
Glucose, Bld: 86 mg/dL (ref 70–99)
Potassium: 4 mEq/L (ref 3.5–5.1)
Sodium: 138 mEq/L (ref 135–145)
Total Bilirubin: 1.3 mg/dL — ABNORMAL HIGH (ref 0.2–1.2)
Total Protein: 7.8 g/dL (ref 6.0–8.3)

## 2023-07-22 LAB — LIPID PANEL
Cholesterol: 217 mg/dL — ABNORMAL HIGH (ref 0–200)
HDL: 84.2 mg/dL (ref >39.00)
LDL Cholesterol: 118 mg/dL — ABNORMAL HIGH (ref 0–99)
NonHDL: 133.11
Total CHOL/HDL Ratio: 3
Triglycerides: 76 mg/dL (ref 0.0–149.0)
VLDL: 15.2 mg/dL (ref 0.0–40.0)

## 2023-07-22 MED ORDER — SULFAMETHOXAZOLE-TRIMETHOPRIM 800-160 MG PO TABS: 1.0000 | ORAL_TABLET | Freq: Two times a day (BID) | ORAL | 0 refills | Status: AC

## 2023-07-22 NOTE — Progress Notes (Deleted)
Subjective:  Patient ID: Michele Poole, female    DOB: Jul 05, 1996  Age: 27 y.o. MRN: [id number]  Patient Care Team: Michele Sawyers, FNP as PCP - General (Family Medicine)   CC:  Chief Complaint  Patient presents with   Annual Exam    Not fasting   Would like to discuss having loose stools for 6 weeks. She is having BM multiple times daily. Intermittent stomach cramps.     HPI [employer]. Will is a 27 y.o. female who presents today for a well woman exam. She reports consuming a general diet.  For exercise plays soccer, tennis, and runs.  She generally feels well. She reports sleeping well. She does not have additional problems to discuss today.   Vision:Within last year Dental:Receives regular dental care STD:The patient denies history of sexually transmitted disease.  Periods, pretty regular. No complaints.   Last pap: years ago? Normal findings   HPV vaccination: needs third dose, will get today.   Pt is with acute concerns. About six weeks ago started with multiple bowel movements, usually prior to this went two times a day but has noticed increased abdominal 'bubbling' and was having super runny bowel movements. She has slowed down a little but not as often but still runny. Stomach will still cramp as well. She does notice some mucous in the stool as well. No blood in the stool. She has tried to change foods and see if anything aggravated this but did not seem to stand out. She states now down to about four times a day was about 10 or more times in the beginning.   Right foot great toe with nail bed tenderness, redness, and some clear to yellow discharge from toe. For the last week or so.    Assessment & Plan:  Encounter for Papanicolaou smear of vagina as part of routine gynecological examination Assessment & Plan: Pt verbalized consent for pelvic exam Declines std testing hpv thin prep ordered and pending results Pap exam in office completed, pt tolerated well.    Patient Counseling(The following topics were reviewed):  Preventative care handout given to pt  Health maintenance and immunizations reviewed. Please refer to Health maintenance section. Pt advised on safe sex, wearing seatbelts in car, and proper nutrition labwork ordered today for annual Dental health: Discussed importance of regular tooth brushing, flossing, and dental visits.   Orders: -     Cytology - PAP -     HIV Antibody (routine testing w rflx) -     Hepatitis C antibody -     CBC with Differential/Platelet -     Comprehensive metabolic panel -     Lipid panel  Screening for HIV (human immunodeficiency virus) -     HIV Antibody (routine testing w rflx)  Encounter for hepatitis C screening test for low risk patient -     Hepatitis C antibody  Diarrhea of presumed infectious origin Assessment & Plan: Stool cultures today  Celiac panel ordered and pending Advised pt to decreased fried fatty foods Work on elimination diet. Consider gluten/dairy  Orders: -     Celiac Pnl 2 rflx Endomysial Ab Ttr -     Stool culture; Future -     Comprehensive metabolic panel  Screening for lipoid disorders -     Lipid panel  Paronychia of great toe of right foot Assessment & Plan: Pt advised to:  Please monitor site for worsening signs/symptoms of infection to include: increasing redness, increasing tenderness, increase in  size, and or pustulant drainage from site. If this is to occur please let me know immediately.   Rx bactrim 800/160 mg po bid x 7 days   Orders: -     Sulfamethoxazole-Trimethoprim; Take 1 tablet by mouth 2 (two) times daily for 7 days.  Dispense: 14 tablet; Refill: 0    I have independently evaluated patient.  Michele Poole is a 27 y.o. female who is *** risk for a *** risk surgery.  There {Actions; are/are not:16769} modifiable risk factors (smoking, etc). Michele Poole's RCRI/NSQIP calculation for MACE is: ***.    Review meds: If indicated, NO  ACE-I/ARB day of surgery. OK to do BB or statin day of (esp if vascular sx)  No follow-ups on file.  Michele Sawyers, MSN, APRN, FNP-C [facility]  Subjective:      HPI: Pt is a 27 y.o. female who is here for preoperative clearance for ***  1) High Risk Cardiac Conditions:  1) Recent MI - {yes no:314532}  2) Decompensated Heart Failure - {yes no:314532}  3) Unstable angina - {yes no:314532}  4) Symptomatic arrythmia - {yes no:314532}  5) Sx Valvular Disease - {yes no:314532}  2) Intermediate Risk Factors: DM, CKD, CVA, CHF, CAD - {yes no:314532}  2) Functional Status: > 4 mets (Walk, run, climb stairs) {yes no:314532}. [facility] Activity Status Index: ***  3) Surgery Specific Risk: High (Emergency, Vascular, Intra-abdominal, Extensive ops)          Intermediate (Carotid, Head and Neck, Orthopaedic )          Low (Endoscopic, Cataract, Breast )  4) Further Noninvasive evaluation:   1) EKG - {yes no:314532}   Hx of MI, CVA, CAD, DM, CKD  2) Echo - {yes no:314532}   Worsening dyspnea or CHF without an echo in the past year  3) Stress Testing - Active Cardiac Disease - {yes no:314532}  4) CXR {yes no:314532}   If asymptomatic, healthy, no respiratory symptoms it is not needed  5)PFTs {yes no:314532}   OSA, OHS, or significant cardiopulmonary history  5) Need for medical therapy - Beta Blocker, Statins indicated ? {yes [id number]}  New complaints: ***  Social history:  Relevant past medical, surgical, family and social history reviewed and updated as indicated. Interim medical history since our last visit reviewed.  Allergies and medications reviewed and updated.  DATA REVIEWED: CHART IN EPIC  ROS: Negative unless specifically indicated above in HPI.    Current Outpatient Medications:    desogestrel-ethinyl estradiol (APRI) 0.15-30 MG-MCG tablet, Take 1 tablet by mouth daily., Disp: 84 tablet, Rfl: 3   sulfamethoxazole-trimethoprim (BACTRIM DS)  800-160 MG tablet, Take 1 tablet by mouth 2 (two) times daily for 7 days., Disp: 14 tablet, Rfl: 0      Objective:    BP (!) 106/56   Pulse (!) 59   Temp 98 F (36.7 C) (Temporal)   Ht 5\' 1"  (1.549 m)   Wt 114 lb (51.7 kg)   LMP 07/09/2023 (Exact Date)   SpO2 99%   BMI 21.54 kg/m   Wt Readings from Last 3 Encounters:  07/22/23 114 lb (51.7 kg)  03/20/23 115 lb 3.2 oz (52.3 kg)  11/14/22 110 lb 3.7 oz (50 kg)    Physical Exam Constitutional:      Appearance: Normal appearance. She is normal weight.  Cardiovascular:     Rate and Rhythm: Normal rate.  Pulmonary:     Effort: Pulmonary effort is normal.  Genitourinary:    Labia:        Right: No rash, tenderness or lesion.        Left: No rash, tenderness or lesion.      Vagina: Normal. No vaginal discharge.     Cervix: Normal. No friability or erythema.     Uterus: Normal.      Adnexa: Right adnexa normal and left adnexa normal.  Neurological:     Mental Status: She is alert.             Wt Readings from Last 3 Encounters:  07/22/23 114 lb (51.7 kg)  03/20/23 115 lb 3.2 oz (52.3 kg)  11/14/22 110 lb 3.7 oz (50 kg)      Advanced Directives Patient does not have advanced directives  DEPRESSION SCREENING    07/22/2023    7:33 AM 03/20/2023    9:12 AM 04/23/2022    2:52 PM 03/04/2019    3:12 PM 02/09/2019    2:48 PM 03/27/2018    2:06 PM  PHQ 2/9 Scores  PHQ - 2 Score 0 0 0 0 0 0     ROS: Negative unless specifically indicated above in HPI.    Current Outpatient Medications:    desogestrel-ethinyl estradiol (APRI) 0.15-30 MG-MCG tablet, Take 1 tablet by mouth daily., Disp: 84 tablet, Rfl: 3   sulfamethoxazole-trimethoprim (BACTRIM DS) 800-160 MG tablet, Take 1 tablet by mouth 2 (two) times daily for 7 days., Disp: 14 tablet, Rfl: 0    Objective:    BP (!) 106/56   Pulse (!) 59   Temp 98 F (36.7 C) (Temporal)   Ht 5\' 1"  (1.549 m)   Wt 114 lb (51.7 kg)   LMP 07/09/2023 (Exact Date)   SpO2 99%    BMI 21.54 kg/m   BP Readings from Last 3 Encounters:  07/22/23 (!) 106/56  03/20/23 102/66  11/14/22 105/73      @PHYSEXAMBYAGE @  Gen: NAD, resting comfortably Breasts: breasts appear normal, no suspicious masses, no skin or nipple changes or axillary nodes Physical Exam Genitourinary:    General: Normal vulva.     Pubic Area: No rash.      Labia:        Right: No rash, tenderness, lesion or injury.        Left: No rash, tenderness, lesion or injury.      Urethra: No prolapse or urethral pain.     Vagina: Normal. No vaginal discharge, tenderness or lesions.     Cervix: No discharge.     Rectum: Normal.  Psych: Normal affect and thought content      Assessment & Plan:  Encounter for Papanicolaou smear of vagina as part of routine gynecological examination Assessment & Plan: Pt verbalized consent for pelvic exam Declines std testing hpv thin prep ordered and pending results Pap exam in office completed, pt tolerated well.   Patient Counseling(The following topics were reviewed):  Preventative care handout given to pt  Health maintenance and immunizations reviewed. Please refer to Health maintenance section. Pt advised on safe sex, wearing seatbelts in car, and proper nutrition labwork ordered today for annual Dental health: Discussed importance of regular tooth brushing, flossing, and dental visits.   Orders: -     Cytology - PAP -     HIV Antibody (routine testing w rflx) -     Hepatitis C antibody -     CBC with Differential/Platelet -     Comprehensive metabolic panel -  Lipid panel  Screening for HIV (human immunodeficiency virus) -     HIV Antibody (routine testing w rflx)  Encounter for hepatitis C screening test for low risk patient -     Hepatitis C antibody  Diarrhea of presumed infectious origin Assessment & Plan: Stool cultures today  Celiac panel ordered and pending Advised pt to decreased fried fatty foods Work on elimination diet.  Consider gluten/dairy  Orders: -     Celiac Pnl 2 rflx Endomysial Ab Ttr -     Stool culture; Future -     Comprehensive metabolic panel  Screening for lipoid disorders -     Lipid panel  Paronychia of great toe of right foot Assessment & Plan: Pt advised to:  Please monitor site for worsening signs/symptoms of infection to include: increasing redness, increasing tenderness, increase in size, and or pustulant drainage from site. If this is to occur please let me know immediately.   Rx bactrim 800/160 mg po bid x 7 days   Orders: -     Sulfamethoxazole-Trimethoprim; Take 1 tablet by mouth 2 (two) times daily for 7 days.  Dispense: 14 tablet; Refill: 0      Follow-up: No follow-ups on file.   Michele Sawyers, FNP

## 2023-07-22 NOTE — Assessment & Plan Note (Signed)
Stool cultures today  Celiac panel ordered and pending Advised pt to decreased fried fatty foods Work on elimination diet. Consider gluten/dairy

## 2023-07-22 NOTE — Assessment & Plan Note (Signed)
Pt verbalized consent for pelvic exam Declines std testing hpv thin prep ordered and pending results Pap exam in office completed, pt tolerated well.   Patient Counseling(The following topics were reviewed):  Preventative care handout given to pt  Health maintenance and immunizations reviewed. Please refer to Health maintenance section. Pt advised on safe sex, wearing seatbelts in car, and proper nutrition labwork ordered today for annual Dental health: Discussed importance of regular tooth brushing, flossing, and dental visits.

## 2023-07-22 NOTE — Assessment & Plan Note (Signed)
Pt advised to:  Please monitor site for worsening signs/symptoms of infection to include: increasing redness, increasing tenderness, increase in size, and or pustulant drainage from site. If this is to occur please let me know immediately.   Rx bactrim 800/160 mg po bid x 7 days

## 2023-07-22 NOTE — Patient Instructions (Signed)
  Stop by the lab prior to leaving today. I will notify you of your results once received.   Recommendations on keeping yourself healthy:  - Exercise at least 30-45 minutes a day, 3-4 days a week.  - Eat a low-fat diet with lots of fruits and vegetables, up to 7-9 servings per day.  - Seatbelts can save your life. Wear them always.  - Smoke detectors on every level of your home, check batteries every year.  - Eye Doctor - have an eye exam every 1-2 years  - Safe sex - if you may be exposed to STDs, use a condom.  - Alcohol -  If you drink, do it moderately, less than 2 drinks per day.  - Health Care Power of Attorney. Choose someone to speak for you if you are not able.  - Depression is common in our stressful world.If you're feeling down or losing interest in things you normally enjoy, please come in for a visit.  - Violence - If anyone is threatening or hurting you, please call immediately.  Due to recent changes in healthcare laws, you may see results of your imaging and/or laboratory studies on MyChart before I have had a chance to review them.  I understand that in some cases there may be results that are confusing or concerning to you. Please understand that not all results are received at the same time and often I may need to interpret multiple results in order to provide you with the best plan of care or course of treatment. Therefore, I ask that you please give me 2 business days to thoroughly review all your results before contacting my office for clarification. Should we see a critical lab result, you will be contacted sooner.   I will see you again in one year for your annual comprehensive exam unless otherwise stated and or with acute concerns.  It was a pleasure seeing you today! Please do not hesitate to reach out with any questions and or concerns.  Regards,   Tabitha Dugal    

## 2023-07-22 NOTE — Progress Notes (Signed)
Subjective:  Patient ID: Michele Poole, female    DOB: 03/08/1996  Age: 27 y.o. MRN: [id number]  Patient Care Team: Mort Sawyers, FNP as PCP - General (Family Medicine)   CC:  Chief Complaint  Patient presents with   Annual Exam    Not fasting   Would like to discuss having loose stools for 6 weeks. She is having BM multiple times daily. Intermittent stomach cramps.     HPI [employer]. [name] is a 27 y.o. female who presents today for a well woman exam. She reports consuming a general diet.  Soccer, running and tennis  She generally feels well. She reports sleeping well. She does have additional problems to discuss today.   HPV vaccination: needs third dose, will get today.   Pt is with acute concerns. About six weeks ago started with multiple bowel movements, usually prior to this went two times a day but has noticed increased abdominal 'bubbling' and was having super runny bowel movements. She has slowed down a little but not as often but still runny. Stomach will still cramp as well. She does notice some mucous in the stool as well. No blood in the stool. She has tried to change foods and see if anything aggravated this but did not seem to stand out. She states now down to about four times a day was about 10 or more times in the beginning.   Right foot great toe with nail bed tenderness, redness, and some clear to yellow discharge from toe. For the last week or so.   Vision:Within last year Dental:Receives regular dental care STD:The patient denies history of sexually transmitted disease.   Last pap: years ago?   Pt is without acute concerns.   Advanced Directives Patient does not have advanced directives   DEPRESSION SCREENING    07/22/2023    7:33 AM 03/20/2023    9:12 AM 04/23/2022    2:52 PM 03/04/2019    3:12 PM 02/09/2019    2:48 PM 03/27/2018    2:06 PM  PHQ 2/9 Scores  PHQ - 2 Score 0 0 0 0 0 0     ROS: Negative unless specifically indicated above in HPI.     Current Outpatient Medications:    desogestrel-ethinyl estradiol (APRI) 0.15-30 MG-MCG tablet, Take 1 tablet by mouth daily., Disp: 84 tablet, Rfl: 3   sulfamethoxazole-trimethoprim (BACTRIM DS) 800-160 MG tablet, Take 1 tablet by mouth 2 (two) times daily for 7 days., Disp: 14 tablet, Rfl: 0    Objective:    BP (!) 106/56   Pulse (!) 59   Temp 98 F (36.7 C) (Temporal)   Ht 5\' 1"  (1.549 m)   Wt 114 lb (51.7 kg)   LMP 07/09/2023 (Exact Date)   SpO2 99%   BMI 21.54 kg/m   BP Readings from Last 3 Encounters:  07/22/23 (!) 106/56  03/20/23 102/66  11/14/22 105/73      @PHYSEXAMBYAGE @  Gen: NAD, resting comfortably Breasts: breasts appear normal, no suspicious masses, no skin or nipple changes or axillary nodes Physical Exam Genitourinary:    General: Normal vulva.     Pubic Area: No rash.      Labia:        Right: No rash, tenderness, lesion or injury.        Left: No rash, tenderness, lesion or injury.      Urethra: No prolapse or urethral pain.     Vagina: Normal. No vaginal discharge, tenderness or lesions.  Cervix: No discharge.     Rectum: Normal.  Psych: Normal affect and thought content      Assessment & Plan:  Encounter for Papanicolaou smear of vagina as part of routine gynecological examination Assessment & Plan: Pt verbalized consent for pelvic exam Declines std testing hpv thin prep ordered and pending results Pap exam in office completed, pt tolerated well.   Patient Counseling(The following topics were reviewed):  Preventative care handout given to pt  Health maintenance and immunizations reviewed. Please refer to Health maintenance section. Pt advised on safe sex, wearing seatbelts in car, and proper nutrition labwork ordered today for annual Dental health: Discussed importance of regular tooth brushing, flossing, and dental visits.   Orders: -     Cytology - PAP -     HIV Antibody (routine testing w rflx) -     Hepatitis C  antibody -     CBC with Differential/Platelet -     Comprehensive metabolic panel -     Lipid panel  Screening for HIV (human immunodeficiency virus) -     HIV Antibody (routine testing w rflx)  Encounter for hepatitis C screening test for low risk patient -     Hepatitis C antibody  Diarrhea of presumed infectious origin Assessment & Plan: Stool cultures today  Celiac panel ordered and pending Advised pt to decreased fried fatty foods Work on elimination diet. Consider gluten/dairy  Orders: -     Celiac Pnl 2 rflx Endomysial Ab Ttr -     Stool culture; Future -     Comprehensive metabolic panel  Screening for lipoid disorders -     Lipid panel  Paronychia of great toe of right foot Assessment & Plan: Pt advised to:  Please monitor site for worsening signs/symptoms of infection to include: increasing redness, increasing tenderness, increase in size, and or pustulant drainage from site. If this is to occur please let me know immediately.   Rx bactrim 800/160 mg po bid x 7 days   Orders: -     Sulfamethoxazole-Trimethoprim; Take 1 tablet by mouth 2 (two) times daily for 7 days.  Dispense: 14 tablet; Refill: 0  Need for prophylactic vaccination against human papillomavirus (HPV) types 6, 11, 16, and 18 -     HPV 9-valent vaccine,Recombinat      Follow-up: Return in about 1 year (around 07/21/2024) for f/u CPE.   Mort Sawyers, FNP

## 2023-07-23 DIAGNOSIS — R197 Diarrhea, unspecified: Secondary | ICD-10-CM | POA: Diagnosis not present

## 2023-07-23 LAB — HEPATITIS C ANTIBODY: Hepatitis C Ab: NONREACTIVE

## 2023-07-23 LAB — HIV ANTIBODY (ROUTINE TESTING W REFLEX): HIV 1&2 Ab, 4th Generation: NONREACTIVE

## 2023-07-24 LAB — CYTOLOGY - PAP
Comment: NEGATIVE
Diagnosis: NEGATIVE
High risk HPV: NEGATIVE

## 2023-07-25 LAB — STOOL CULTURE: E coli, Shiga toxin Assay: NEGATIVE

## 2023-08-01 DIAGNOSIS — K529 Noninfective gastroenteritis and colitis, unspecified: Secondary | ICD-10-CM | POA: Diagnosis not present

## 2023-08-05 DIAGNOSIS — K529 Noninfective gastroenteritis and colitis, unspecified: Secondary | ICD-10-CM | POA: Diagnosis not present

## 2023-09-16 DIAGNOSIS — K529 Noninfective gastroenteritis and colitis, unspecified: Secondary | ICD-10-CM | POA: Diagnosis not present

## 2023-10-23 DIAGNOSIS — R197 Diarrhea, unspecified: Secondary | ICD-10-CM | POA: Diagnosis not present

## 2023-10-23 DIAGNOSIS — K2930 Chronic superficial gastritis without bleeding: Secondary | ICD-10-CM | POA: Diagnosis not present

## 2023-10-23 DIAGNOSIS — R1013 Epigastric pain: Secondary | ICD-10-CM | POA: Diagnosis not present

## 2023-11-05 NOTE — Progress Notes (Signed)
noted 

## 2024-01-07 DIAGNOSIS — K529 Noninfective gastroenteritis and colitis, unspecified: Secondary | ICD-10-CM | POA: Diagnosis not present

## 2024-01-07 DIAGNOSIS — R1013 Epigastric pain: Secondary | ICD-10-CM | POA: Diagnosis not present

## 2024-01-09 DIAGNOSIS — M25561 Pain in right knee: Secondary | ICD-10-CM | POA: Diagnosis not present

## 2024-01-13 ENCOUNTER — Encounter: Payer: Self-pay | Admitting: Family

## 2024-03-16 ENCOUNTER — Other Ambulatory Visit: Payer: Self-pay | Admitting: Family

## 2024-03-16 DIAGNOSIS — Z3041 Encounter for surveillance of contraceptive pills: Secondary | ICD-10-CM

## 2024-03-16 NOTE — Telephone Encounter (Signed)
 Called  pt and schedule a appt for cpe

## 2024-07-22 ENCOUNTER — Encounter: Admitting: Family

## 2024-09-02 ENCOUNTER — Other Ambulatory Visit: Payer: Self-pay | Admitting: Family

## 2024-09-02 DIAGNOSIS — Z3041 Encounter for surveillance of contraceptive pills: Secondary | ICD-10-CM

## 2024-10-26 ENCOUNTER — Ambulatory Visit (INDEPENDENT_AMBULATORY_CARE_PROVIDER_SITE_OTHER): Admitting: Family

## 2024-10-26 VITALS — BP 106/68 | HR 74 | Temp 98.3°F | Ht 61.0 in | Wt 120.4 lb

## 2024-10-26 DIAGNOSIS — Z3041 Encounter for surveillance of contraceptive pills: Secondary | ICD-10-CM

## 2024-10-26 DIAGNOSIS — Z23 Encounter for immunization: Secondary | ICD-10-CM | POA: Diagnosis not present

## 2024-10-26 NOTE — Progress Notes (Signed)
 Subjective:  Patient ID: Michele Poole, female    DOB: September 20, 1996  Age: 28 y.o. MRN: [id number]  Patient Care Team: Corwin Antu, FNP as PCP - General (Family Medicine)   CC: No chief complaint on file.   HPI [employer]. Michele Poole is a 28 y.o. female who presents today for an annual physical exam. She reports consuming a general diet. Running and core exercises, runs about 1-3 miles daily  She generally feels well. She reports sleeping well. She does not have additional problems to discuss today.   Vision:Within last year Dental:Receives regular dental care  Last pap: July 22 2023   Pt is without acute concerns.   Advanced Directives Patient does not have advanced directives  DEPRESSION SCREENING    10/26/2024   10:09 AM 07/22/2023    7:33 AM 03/20/2023    9:12 AM 04/23/2022    2:52 PM 03/04/2019    3:12 PM 02/09/2019    2:48 PM 03/27/2018    2:06 PM  PHQ 2/9 Scores  PHQ - 2 Score 0 0 0 0 0 0 0  PHQ- 9 Score 0           ROS: Negative unless specifically indicated above in HPI.    Current Outpatient Medications:    desogestrel -ethinyl estradiol  (APRI ) 0.15-30 MG-MCG tablet, Take 1 tablet by mouth daily. MUST HAVE OV FOR FURTHER REFILLS, Disp: 84 tablet, Rfl: 0    Objective:    BP 106/68 (BP Location: Left Arm, Patient Position: Sitting, Cuff Size: Normal)   Pulse 74   Temp 98.3 F (36.8 C) (Temporal)   Ht 5' 1 (1.549 m)   Wt 120 lb 6.4 oz (54.6 kg)   SpO2 95%   BMI 22.75 kg/m   BP Readings from Last 3 Encounters:  10/26/24 106/68  07/22/23 (!) 106/56  03/20/23 102/66      Physical Exam Vitals reviewed.  Constitutional:      General: She is not in acute distress.    Appearance: Normal appearance. She is normal weight. She is not ill-appearing.  HENT:     Head: Normocephalic.     Right Ear: Tympanic membrane normal.     Left Ear: Tympanic membrane normal.     Nose: Nose normal.     Mouth/Throat:     Mouth: Mucous membranes are moist.  Eyes:      Extraocular Movements: Extraocular movements intact.     Pupils: Pupils are equal, round, and reactive to light.  Cardiovascular:     Rate and Rhythm: Normal rate and regular rhythm.  Pulmonary:     Effort: Pulmonary effort is normal.     Breath sounds: Normal breath sounds.  Abdominal:     General: Abdomen is flat. Bowel sounds are normal.     Palpations: Abdomen is soft.     Tenderness: There is no guarding or rebound.  Musculoskeletal:        General: Normal range of motion.     Cervical back: Normal range of motion.  Skin:    General: Skin is warm.     Capillary Refill: Capillary refill takes less than 2 seconds.  Neurological:     General: No focal deficit present.     Mental Status: She is alert.  Psychiatric:        Mood and Affect: Mood normal.        Behavior: Behavior normal.        Thought Content: Thought content normal.  Judgment: Judgment normal.       Results LABS Eosinophils: elevated, suggestive of allergic reaction Cholesterol: borderline high      Assessment & Plan:   Assessment and Plan Assessment & Plan Adult Wellness Visit Routine adult wellness visit with no specific concerns or symptoms. Engages in regular exercise, including running and core exercises. Up to date with eye and dental exams. Pap smear completed in July 2024, next due in three years. No current plans for pregnancy but considering in the future. Declined flu vaccine. Discussed meningococcal B vaccine, informed consent provided regarding risks of meningitis and benefits of vaccination. - Administer meningococcal B vaccine if six months have passed since the first dose. - Check urine sample to confirm not pregnant due to birth control use.  -Patient Counseling(The following topics were reviewed):  Preventative care handout given to pt  Health maintenance and immunizations reviewed. Please refer to Health maintenance section. Pt advised on safe sex, wearing seatbelts in car,  and proper nutrition labwork ordered today for annual Dental health: Discussed importance of regular tooth brushing, flossing, and dental visits.  Allergic rhinitis Chronic allergic rhinitis with significant right nasal passage swelling, nasal congestion, and drainage. Currently using Zyrtec and occasionally Flonase. - Use Flonase regularly for nasal congestion.          Follow-up: Return in about 1 year (around 10/26/2025) for f/u CPE.   Michele Patrick, FNP

## 2024-11-22 ENCOUNTER — Other Ambulatory Visit: Payer: Self-pay | Admitting: Family

## 2024-11-22 DIAGNOSIS — Z3041 Encounter for surveillance of contraceptive pills: Secondary | ICD-10-CM

## 2025-03-03 ENCOUNTER — Encounter: Admitting: Family
